# Patient Record
Sex: Male | Born: 1990 | Race: White | Hispanic: No | Smoking: Never smoker
Health system: Southern US, Community
[De-identification: ages and names within clinical notes are randomized; demographics above are authoritative.]

## PROBLEM LIST (undated history)

## (undated) DIAGNOSIS — F329 Major depressive disorder, single episode, unspecified: Secondary | ICD-10-CM

## (undated) DIAGNOSIS — F32A Depression, unspecified: Secondary | ICD-10-CM

## (undated) DIAGNOSIS — F419 Anxiety disorder, unspecified: Secondary | ICD-10-CM

## (undated) DIAGNOSIS — Z87820 Personal history of traumatic brain injury: Secondary | ICD-10-CM

## (undated) DIAGNOSIS — G43909 Migraine, unspecified, not intractable, without status migrainosus: Secondary | ICD-10-CM

## (undated) HISTORY — PX: FRACTURE SURGERY: SHX138

---
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
## 2013-07-30 ENCOUNTER — Emergency Department (HOSPITAL_BASED_OUTPATIENT_CLINIC_OR_DEPARTMENT_OTHER)
Admission: EM | Admit: 2013-07-30 | Discharge: 2013-07-30 | Disposition: A | Discharge status: Home / Self Care | Payer: Worker's Compensation | Attending: Emergency Medicine | Admitting: Emergency Medicine

## 2013-07-30 ENCOUNTER — Encounter (HOSPITAL_BASED_OUTPATIENT_CLINIC_OR_DEPARTMENT_OTHER): Payer: Self-pay | Admitting: Emergency Medicine

## 2013-07-30 DIAGNOSIS — Z880 Allergy status to penicillin: Secondary | ICD-10-CM | POA: Insufficient documentation

## 2013-07-30 DIAGNOSIS — Y9301 Activity, walking, marching and hiking: Secondary | ICD-10-CM | POA: Insufficient documentation

## 2013-07-30 DIAGNOSIS — S0083XA Contusion of other part of head, initial encounter: Secondary | ICD-10-CM

## 2013-07-30 DIAGNOSIS — Y9289 Other specified places as the place of occurrence of the external cause: Secondary | ICD-10-CM | POA: Insufficient documentation

## 2013-07-30 DIAGNOSIS — W2209XA Striking against other stationary object, initial encounter: Secondary | ICD-10-CM | POA: Insufficient documentation

## 2013-07-30 DIAGNOSIS — S0003XA Contusion of scalp, initial encounter: Secondary | ICD-10-CM | POA: Insufficient documentation

## 2013-07-30 NOTE — ED Provider Notes (Signed)
Medical screening examination/treatment/procedure(s) were performed by non-physician practitioner and as supervising physician I was immediately available for consultation/collaboration.  EKG Interpretation   None        Yarrow Linhart, MD 07/30/13 1956 

## 2013-07-30 NOTE — ED Notes (Signed)
Hit the top of his head on a wooden header when walking down steps. No loc. Small hematoma palpated.

## 2013-07-30 NOTE — ED Provider Notes (Signed)
CSN: [id number]     Arrival date & time 07/30/13  1835 History   First MD Initiated Contact with Patient 07/30/13 1902     Chief Complaint  Patient presents with  . Head Injury   (Consider location/radiation/quality/duration/timing/severity/associated sxs/prior Treatment) Patient is a 22 y.o. male presenting with head injury. The history is provided by the patient. No language interpreter was used.  Head Injury Location:  Generalized Time since incident:  1 day Mechanism of injury: direct blow   Pain details:    Quality:  Aching   Severity:  Mild   Timing:  Constant Chronicity:  New Relieved by:  Nothing Worsened by:  Nothing tried Ineffective treatments:  None tried Associated symptoms: headache   Associated symptoms: no blurred vision, no double vision, no loss of consciousness, no nausea and no vomiting     History reviewed. No pertinent past medical history. History reviewed. No pertinent past surgical history. No family history on file. History  Substance Use Topics  . Smoking status: Never Smoker   . Smokeless tobacco: Not on file  . Alcohol Use: Yes    Review of Systems  Eyes: Negative for blurred vision and double vision.  Gastrointestinal: Negative for nausea and vomiting.  Neurological: Positive for headaches. Negative for loss of consciousness.  All other systems reviewed and are negative.    Allergies  Penicillins  Home Medications  No current outpatient prescriptions on file. BP 134/75  Pulse 78  Temp(Src) 97.8 F (36.6 C) (Oral)  Resp 24  Ht 6' (1.829 m)  Wt 165 lb (74.844 kg)  BMI 22.37 kg/m2  SpO2 100% Physical Exam  Nursing note and vitals reviewed. Constitutional: He is oriented to person, place, and time. He appears well-developed and well-nourished.  HENT:  Head: Normocephalic.  Right Ear: External ear normal.  Left Ear: External ear normal.  Nose: Nose normal.  Mouth/Throat: Oropharynx is clear and moist.  Eyes: Conjunctivae and  EOM are normal. Pupils are equal, round, and reactive to light.  Neck: Normal range of motion. Neck supple.  Cardiovascular: Normal rate.   Pulmonary/Chest: Effort normal.  Abdominal: Soft.  Musculoskeletal: Normal range of motion.  Neurological: He is alert and oriented to person, place, and time. He has normal reflexes.  Skin: Skin is warm.  Psychiatric: He has a normal mood and affect.    ED Course  Procedures (including critical care time) Labs Review Labs Reviewed - No data to display Imaging Review No results found.  EKG Interpretation   None       MDM   1. Contusion of forehead, initial encounter     Tylenol  For headache    Elson Areas, PA-C 07/30/13 1934

## 2015-09-13 ENCOUNTER — Emergency Department (HOSPITAL_COMMUNITY): Payer: Managed Care, Other (non HMO)

## 2015-09-13 ENCOUNTER — Emergency Department (HOSPITAL_COMMUNITY)
Admission: EM | Admit: 2015-09-13 | Discharge: 2015-09-13 | Disposition: A | Discharge status: Home / Self Care | Payer: Managed Care, Other (non HMO) | Attending: Emergency Medicine | Admitting: Emergency Medicine

## 2015-09-13 ENCOUNTER — Encounter (HOSPITAL_COMMUNITY): Payer: Self-pay | Admitting: Emergency Medicine

## 2015-09-13 DIAGNOSIS — Z880 Allergy status to penicillin: Secondary | ICD-10-CM | POA: Diagnosis not present

## 2015-09-13 DIAGNOSIS — Y92009 Unspecified place in unspecified non-institutional (private) residence as the place of occurrence of the external cause: Secondary | ICD-10-CM | POA: Insufficient documentation

## 2015-09-13 DIAGNOSIS — S42491A Other displaced fracture of lower end of right humerus, initial encounter for closed fracture: Secondary | ICD-10-CM | POA: Insufficient documentation

## 2015-09-13 DIAGNOSIS — S4991XA Unspecified injury of right shoulder and upper arm, initial encounter: Secondary | ICD-10-CM | POA: Diagnosis present

## 2015-09-13 DIAGNOSIS — W109XXA Fall (on) (from) unspecified stairs and steps, initial encounter: Secondary | ICD-10-CM | POA: Diagnosis not present

## 2015-09-13 DIAGNOSIS — Y9389 Activity, other specified: Secondary | ICD-10-CM | POA: Diagnosis not present

## 2015-09-13 DIAGNOSIS — Y998 Other external cause status: Secondary | ICD-10-CM | POA: Insufficient documentation

## 2015-09-13 DIAGNOSIS — Z79899 Other long term (current) drug therapy: Secondary | ICD-10-CM | POA: Insufficient documentation

## 2015-09-13 DIAGNOSIS — S42301A Unspecified fracture of shaft of humerus, right arm, initial encounter for closed fracture: Secondary | ICD-10-CM

## 2015-09-13 LAB — CBC
HEMATOCRIT: 40.5 % (ref 39.0–52.0)
HEMOGLOBIN: 14.3 g/dL (ref 13.0–17.0)
MCH: 31.3 pg (ref 26.0–34.0)
MCHC: 35.3 g/dL (ref 30.0–36.0)
MCV: 88.6 fL (ref 78.0–100.0)
Platelets: 288 10*3/uL (ref 150–400)
RBC: 4.57 MIL/uL (ref 4.22–5.81)
RDW: 12.6 % (ref 11.5–15.5)
WBC: 15 10*3/uL — AB (ref 4.0–10.5)

## 2015-09-13 LAB — COMPREHENSIVE METABOLIC PANEL
ALBUMIN: 4 g/dL (ref 3.5–5.0)
ALT: 16 U/L — ABNORMAL LOW (ref 17–63)
ANION GAP: 14 (ref 5–15)
AST: 22 U/L (ref 15–41)
Alkaline Phosphatase: 41 U/L (ref 38–126)
BILIRUBIN TOTAL: 0.6 mg/dL (ref 0.3–1.2)
BUN: 18 mg/dL (ref 6–20)
CHLORIDE: 103 mmol/L (ref 101–111)
CO2: 21 mmol/L — ABNORMAL LOW (ref 22–32)
Calcium: 8.8 mg/dL — ABNORMAL LOW (ref 8.9–10.3)
Creatinine, Ser: 0.91 mg/dL (ref 0.61–1.24)
GFR calc Af Amer: >60 mL/min (ref >60)
Glucose, Bld: 113 mg/dL — ABNORMAL HIGH (ref 65–99)
POTASSIUM: 3.7 mmol/L (ref 3.5–5.1)
Sodium: 138 mmol/L (ref 135–145)
TOTAL PROTEIN: 6.6 g/dL (ref 6.5–8.1)

## 2015-09-13 LAB — PROTIME-INR
INR: 1.13 (ref 0.00–1.49)
PROTHROMBIN TIME: 14.7 s (ref 11.6–15.2)

## 2015-09-13 LAB — ETHANOL: Alcohol, Ethyl (B): 96 mg/dL — ABNORMAL HIGH (ref <5)

## 2015-09-13 MED ORDER — FENTANYL CITRATE (PF) 100 MCG/2ML IJ SOLN
50.0000 ug | Freq: Once | INTRAMUSCULAR | Status: AC
  Administered 2015-09-13: 50 ug via INTRAVENOUS
  Filled 2015-09-13: qty 2

## 2015-09-13 MED ORDER — HYDROMORPHONE HCL 1 MG/ML IJ SOLN
1.0000 mg | Freq: Once | INTRAMUSCULAR | Status: AC
  Administered 2015-09-13: 1 mg via INTRAVENOUS
  Filled 2015-09-13: qty 1

## 2015-09-13 MED ORDER — OXYCODONE-ACETAMINOPHEN 5-325 MG PO TABS
2.0000 | ORAL_TABLET | ORAL | Status: DC | PRN
Start: 2015-09-13 — End: 2015-10-05

## 2015-09-13 MED ORDER — ONDANSETRON HCL 4 MG/2ML IJ SOLN
4.0000 mg | Freq: Once | INTRAMUSCULAR | Status: AC
  Administered 2015-09-13: 4 mg via INTRAVENOUS
  Filled 2015-09-13: qty 2

## 2015-09-13 MED ORDER — OXYCODONE-ACETAMINOPHEN 5-325 MG PO TABS
1.0000 | ORAL_TABLET | Freq: Once | ORAL | Status: AC
  Administered 2015-09-13: 1 via ORAL
  Filled 2015-09-13: qty 1

## 2015-09-13 NOTE — ED Notes (Signed)
Pt given 150 fentanyl en route

## 2015-09-13 NOTE — ED Notes (Signed)
Pt mother at nurses station stating that this RN was refusing to give the pt pain medicine. Told pt mother that I had Fentanyl in my hand to admin to pt per triage pain protocols orders and I was not allowed to admin other drugs until the pt had been seen by a provider

## 2015-09-13 NOTE — ED Notes (Addendum)
Pt mother at nurses station again stating that it is ridiculous that we are not doing anything for her sons pain. Explained to mother that I had been in another room helping another patient and I would be getting her sons pain medicine shortly.

## 2015-09-13 NOTE — ED Notes (Signed)
PA Riley at bedside.  

## 2015-09-13 NOTE — ED Provider Notes (Signed)
MSE was initiated and I personally evaluated the patient and placed orders (if any) at  5:22 AM on September 13, 2015.  The patient appears stable so that the remainder of the MSE may be completed by another provider.  Patient presents following a fall. Right upper arm deformity. EtOH on board. Reports a mechanical fall. Denies loss of consciousness. Is otherwise healthy and not on any anticoagulants. Denies pain elsewhere. Denies any abdominal pain shortness of breath, chest pain. ABCs are intact. C-collar in place and no midline tenderness. Normocephalic, atraumatic, deformity noted of the right upper extremity, neurovascularly intact distally. No other obvious injuries. X-rays placed. Will obtain CT head and neck given the patient is intoxicated. Pain medication ordered and basic labwork ordered.  Shon Baton, MD 09/13/15 [phone]

## 2015-09-13 NOTE — ED Notes (Signed)
Patient ambulated with this RN and EDT Gavin Pound. Pt tolerated ambulation well and appeared steady on his feet. Pt provided graham crackers and PO fluids per provider's request.

## 2015-09-13 NOTE — Progress Notes (Signed)
Orthopedic Tech Progress Note Patient Details:  Samuel Herrera [birth year] [id number]  Ortho Devices Type of Ortho Device: Coapt Ortho Device/Splint Location: rue Ortho Device/Splint Interventions: Application   Kendy Haston 09/13/2015, 7:21 AM

## 2015-09-13 NOTE — Discharge Instructions (Signed)
Mr. Schneur Streater,  NicLewis Keats you! Please follow-up with [facility]. Return to the emergency department if you have increased pain, have color changes in your fingers, lose consciousness, have trouble breathing, or lose control of your bladder or bowel. Feel better soon!  S. Lane Hacker, PA-C

## 2015-09-13 NOTE — ED Notes (Signed)
Pt in EMS from home, pt tripped and fell down stairs. Obvious deformity to RUE. Abrasions noted to L chest. Pt denies LOC but unsure if he hit head. Pt in c-collar for precaution. Pt A/OX4. ETOH on board

## 2015-09-13 NOTE — ED Notes (Signed)
PA at bedside explain plan of care to pt and family, pt family became upset and stated that they would like to speak to the physician. Dr Wilkie Aye en route to talk to family

## 2015-09-13 NOTE — ED Notes (Signed)
Orthopedic tech at bedside to apply splint

## 2015-09-13 NOTE — ED Notes (Signed)
Ortho tech en route 

## 2015-09-13 NOTE — ED Notes (Signed)
Horton MD at bedside.  

## 2015-09-13 NOTE — ED Provider Notes (Signed)
CSN: [id number]     Arrival date & time 09/13/15  0458 History   First MD Initiated Contact with Patient 09/13/15 [phone]     Chief Complaint  Patient presents with  . Fall  . Arm Injury   HPI   Samuel Herrera is a 25 y.o. M with no significant PMH presenting s/p mechanical fall down a flight of steps at home. He endorses right upper arm pain and describes the pain as 7/10 pain scale, constant, non-radiating, worsened with movement and palpation. He has been drinking. He may have hit his head. He denies LOC, HA, CP, SOB, anticoagulant use, loss of bladder/bowel control.   History reviewed. No pertinent past medical history. History reviewed. No pertinent past surgical history. No family history on file. Social History  Substance Use Topics  . Smoking status: Never Smoker   . Smokeless tobacco: None  . Alcohol Use: Yes    Review of Systems  Ten systems are reviewed and are negative for acute change except as noted in the HPI  Allergies  Penicillins  Home Medications   Prior to Admission medications   Medication Sig Start Date End Date Taking? Authorizing Provider  amphetamine-dextroamphetamine (ADDERALL XR) 20 MG 24 hr capsule Take 20 mg by mouth daily. 08/24/15 09/22/15 Yes Historical Provider, MD   BP 125/89 mmHg  Pulse 78  Temp(Src) 98.1 F (36.7 C) (Oral)  Resp 18  SpO2 100% Physical Exam  Constitutional: He is oriented to person, place, and time. He appears well-developed and well-nourished. No distress.  HENT:  Head: Normocephalic and atraumatic.  Mouth/Throat: Oropharynx is clear and moist. No oropharyngeal exudate.  Eyes: Conjunctivae are normal. Right eye exhibits no discharge. Left eye exhibits no discharge. No scleral icterus.  Neck: No tracheal deviation present.  Cardiovascular: Normal rate, regular rhythm, normal heart sounds and intact distal pulses.  Exam reveals no gallop and no friction rub.   No murmur heard. Pulmonary/Chest: Effort normal and breath sounds  normal. No respiratory distress. He has no wheezes. He has no rales. He exhibits no tenderness.  Abdominal: Soft. Bowel sounds are normal. He exhibits no distension and no mass. There is no tenderness. There is no rebound and no guarding.  Musculoskeletal: He exhibits tenderness. He exhibits no edema.  Right upper arm deformity.  Cervical, thoracic, and lumbar spine without tenderness, stepoff or deformity.  Neurovascularly intact BL.   Lymphadenopathy:    He has no cervical adenopathy.  Neurological: He is alert and oriented to person, place, and time. Coordination normal.  Skin: Skin is warm and dry. No rash noted. He is not diaphoretic. No erythema.  Psychiatric: He has a normal mood and affect. His behavior is normal.  Nursing note and vitals reviewed.   ED Course  Procedures  Labs Review Labs Reviewed  COMPREHENSIVE METABOLIC PANEL - Abnormal; Notable for the following:    CO2 21 (*)    Glucose, Bld 113 (*)    Calcium 8.8 (*)    ALT 16 (*)    All other components within normal limits  CBC - Abnormal; Notable for the following:    WBC 15.0 (*)    All other components within normal limits  ETHANOL - Abnormal; Notable for the following:    Alcohol, Ethyl (B) 96 (*)    All other components within normal limits  PROTIME-INR  SAMPLE TO BLOOD BANK   Imaging Review Dg Pelvis Portable  09/13/2015  CLINICAL DATA:  Fall down stairs with pain in the right humerus.  Initial encounter. EXAM: PORTABLE PELVIS 1-2 VIEWS COMPARISON:  None. FINDINGS: Artifact from the backboard. The iliac crests are partially excluded from view. Negative for pelvic ring fracture or diastasis. Polygonal structure overlapping the proximal left femur is likely external to the patient as there is no donor fracture site. IMPRESSION: Negative visualized pelvis. Electronically Signed   By: Marnee Spring M.D.   On: 09/13/2015 05:59   Dg Chest Portable 1 View  09/13/2015  CLINICAL DATA:  Fall downstairs with pain in  the right humerus. Initial encounter. EXAM: PORTABLE CHEST 1 VIEW COMPARISON:  None. FINDINGS: Artifact over the chest from back board and EKG leads. Normal heart size and mediastinal contours. No acute infiltrate or edema. No effusion or pneumothorax. No acute osseous findings. IMPRESSION: Negative portable chest. Electronically Signed   By: Marnee Spring M.D.   On: 09/13/2015 05:50   Dg Humerus Right  09/13/2015  CLINICAL DATA:  Fall down stairs with pain in the humerus. Initial encounter. EXAM: RIGHT HUMERUS - 2+ VIEW COMPARISON:  None. FINDINGS: Predominately transverse fracture through the humeral diaphysis with 100% lateral displacement and 16 mm of fracture over riding. Comminution present around the fracture margin. No evidence of dislocation. IMPRESSION: Displaced and over riding humeral diaphysis fracture. Electronically Signed   By: Marnee Spring M.D.   On: 09/13/2015 06:00   I have personally reviewed and evaluated these images and lab results as part of my medical decision-making.  MDM   Final diagnoses:  Humerus fracture, right, closed, initial encounter   Patient non-toxic appearing and VSS. S/p mechanical fall.  Right humerus fracture. Per Alphonsa Overall, PA-C, Coaptation splint, pain meds, and ortho follow-up.  Pelvis xrays, CXR, CMP, INR, CT head and c-spine negative.  CBC with leukocytosis if 15.0, likely due to pain.   Medications  fentaNYL (SUBLIMAZE) injection 50 mcg (50 mcg Intravenous Given 09/13/15 0513)  HYDROmorphone (DILAUDID) injection 1 mg (1 mg Intravenous Given 09/13/15 0537)  ondansetron (ZOFRAN) injection 4 mg (4 mg Intravenous Given 09/13/15 0537)  HYDROmorphone (DILAUDID) injection 1 mg (1 mg Intravenous Given 09/13/15 0701)  oxyCODONE-acetaminophen (PERCOCET/ROXICET) 5-325 MG per tablet 1 tablet (1 tablet Oral Given 09/13/15 0725)   Patient feels improved after observation and/or treatment in ED. Patient ambulated and tolerated PO fluids.   Patient may be safely  discharged home with  New Prescriptions   OXYCODONE-ACETAMINOPHEN (PERCOCET/ROXICET) 5-325 MG TABLET    Take 2 tablets by mouth every 4 (four) hours as needed for severe pain.   Discussed reasons for return. Patient to follow-up with orthopedics. Patient's family in understanding and agreement with the plan.   Melton Krebs, PA-C 09/13/15 1610  Shon Baton, MD 09/13/15 [phone]

## 2015-09-14 LAB — SAMPLE TO BLOOD BANK

## 2015-09-27 ENCOUNTER — Other Ambulatory Visit: Payer: Self-pay | Admitting: Orthopedic Surgery

## 2015-10-02 ENCOUNTER — Encounter (HOSPITAL_COMMUNITY)
Admission: RE | Admit: 2015-10-02 | Discharge: 2015-10-02 | Disposition: A | Discharge status: Home / Self Care | Payer: Managed Care, Other (non HMO) | Source: Ambulatory Visit | Attending: Orthopedic Surgery | Admitting: Orthopedic Surgery

## 2015-10-02 ENCOUNTER — Encounter (HOSPITAL_COMMUNITY): Payer: Self-pay

## 2015-10-02 DIAGNOSIS — W109XXA Fall (on) (from) unspecified stairs and steps, initial encounter: Secondary | ICD-10-CM | POA: Diagnosis not present

## 2015-10-02 DIAGNOSIS — S42301A Unspecified fracture of shaft of humerus, right arm, initial encounter for closed fracture: Secondary | ICD-10-CM | POA: Diagnosis not present

## 2015-10-02 DIAGNOSIS — S6421XA Injury of radial nerve at wrist and hand level of right arm, initial encounter: Secondary | ICD-10-CM | POA: Diagnosis not present

## 2015-10-02 DIAGNOSIS — Z79899 Other long term (current) drug therapy: Secondary | ICD-10-CM | POA: Diagnosis not present

## 2015-10-02 DIAGNOSIS — F988 Other specified behavioral and emotional disorders with onset usually occurring in childhood and adolescence: Secondary | ICD-10-CM | POA: Diagnosis not present

## 2015-10-02 HISTORY — DX: Anxiety disorder, unspecified: F41.9

## 2015-10-02 LAB — CBC
HCT: 41.9 % (ref 39.0–52.0)
HEMOGLOBIN: 14.8 g/dL (ref 13.0–17.0)
MCH: 30.9 pg (ref 26.0–34.0)
MCHC: 35.3 g/dL (ref 30.0–36.0)
MCV: 87.5 fL (ref 78.0–100.0)
Platelets: 368 10*3/uL (ref 150–400)
RBC: 4.79 MIL/uL (ref 4.22–5.81)
RDW: 12.1 % (ref 11.5–15.5)
WBC: 9.8 10*3/uL (ref 4.0–10.5)

## 2015-10-02 LAB — BASIC METABOLIC PANEL
ANION GAP: 13 (ref 5–15)
BUN: 14 mg/dL (ref 6–20)
CALCIUM: 10 mg/dL (ref 8.9–10.3)
CO2: 24 mmol/L (ref 22–32)
CREATININE: 0.94 mg/dL (ref 0.61–1.24)
Chloride: 104 mmol/L (ref 101–111)
GFR calc Af Amer: >60 mL/min (ref >60)
GFR calc non Af Amer: >60 mL/min (ref >60)
GLUCOSE: 98 mg/dL (ref 65–99)
POTASSIUM: 4.1 mmol/L (ref 3.5–5.1)
SODIUM: 141 mmol/L (ref 135–145)

## 2015-10-02 MED ORDER — CHLORHEXIDINE GLUCONATE 4 % EX LIQD: 60.0000 mL | Freq: Once | CUTANEOUS | Status: DC

## 2015-10-02 MED ORDER — VANCOMYCIN HCL IN DEXTROSE 1-5 GM/200ML-% IV SOLN
1000.0000 mg | INTRAVENOUS | Status: AC
  Administered 2015-10-03: 1000 mg via INTRAVENOUS
  Filled 2015-10-02: qty 200

## 2015-10-02 NOTE — Pre-Procedure Instructions (Signed)
Samuel Herrera  10/02/2015      [facility] [address] [address], [place] - [address] AT [facility] OF [facility] & [address] [place] [address] Phone: [phone] Fax: [phone]    Your procedure is scheduled on 10/03/2015.  Report to [facility] Admitting at1P.M.  Call this number if you have problems the morning of surgery:  [phone]   Remember:  Do not eat food or drink liquids after midnight.  On Monday Night    Take these medicines the morning of surgery with A SIP OF WATER : Pain medicine if needed the morning of surgery - is OK   Do not wear jewelry   Do not wear lotions, powders, or perfumes.  You may NOT  wear deodorant.     Men may shave face and neck.   Do not bring valuables to the hospital.   [facility] is not responsible for any belongings or valuables.  Contacts, dentures or bridgework may not be worn into surgery.  Leave your suitcase in the car.  After surgery it may be brought to your room.  For patients admitted to the hospital, discharge time will be determined by your treatment team.  Patients discharged the day of surgery will not be allowed to drive home.   Name and phone number of your driver:   With parents    Special instructions:  Special Instructions: [facility] - Preparing for Surgery  Before surgery, you can play an important role.  Because skin is not sterile, your skin needs to be as free of germs as possible.  You can reduce the number of germs on you skin by washing with CHG (chlorahexidine gluconate) soap before surgery.  CHG is an antiseptic cleaner which kills germs and bonds with the skin to continue killing germs even after washing.  Please DO NOT use if you have an allergy to CHG or antibacterial soaps.  If your skin becomes reddened/irritated stop using the CHG and inform your nurse when you arrive at Short Stay.  Do not shave (including legs and underarms) for at least 48  hours prior to the first CHG shower.  You may shave your face.  Please follow these instructions carefully:   1.  Shower with CHG Soap the night before surgery and the  morning of Surgery.  2.  If you choose to wash your hair, wash your hair first as usual with your  normal shampoo.  3.  After you shampoo, rinse your hair and body thoroughly to remove the  Shampoo.  4.  Use CHG as you would any other liquid soap.  You can apply chg directly to the skin and wash gently with scrungie or a clean washcloth.  5.  Apply the CHG Soap to your body ONLY FROM THE NECK DOWN.    Do not use on open wounds or open sores.  Avoid contact with your eyes, ears, mouth and genitals (private parts).  Wash genitals (private parts)   with your normal soap.  6.  Wash thoroughly, paying special attention to the area where your surgery will be performed.  7.  Thoroughly rinse your body with warm water from the neck down.  8.  DO NOT shower/wash with your normal soap after using and rinsing off   the CHG Soap.  9.  Pat yourself dry with a clean towel.            10.  Wear clean pajamas.            11.  Place clean sheets on your bed the night of your first shower and do not sleep with pets.  Day of Surgery  Do not apply any lotions/deodorants the morning of surgery.  Please wear clean clothes to the hospital/surgery center.  Please read over the following fact sheets that you were given. Pain Booklet, Coughing and Deep Breathing and Surgical Site Infection Prevention

## 2015-10-02 NOTE — Pre-Procedure Instructions (Signed)
Samuel Herrera  10/02/2015      [facility]ORE [address] [address], [place] - [address] AT [facility] OF [facility] & [address] [place] [address] Phone: [phone] Fax: [phone]    Your procedure is scheduled on Tuesday February 21st.  Report to [facility] Admitting at 130 P.M.  Call this number if you have problems the morning of surgery:  [phone]   Remember:  Do not eat food or drink liquids after midnight.  Take these medicines the morning of surgery with A SIP OF WATER Oxycodone-acetaminophen (percocet/roxicet) if needed, Adderall XR if needed  STOP: ALL Vitamins, Supplements, Effient and Herbal Medications, Fish Oils, Aspirins, NSAIDs (Nonsteroidal Anti-inflammatories such as Ibuprofen, Aleve, or Advil), and Goody's/BC Powders tonight.   Do not wear jewelry.  Do not wear lotions, powders, or perfumes.  You may wear deodorant.  Men may shave face and neck.  Do not bring valuables to the hospital.  [facility] is not responsible for any belongings or valuables.  Contacts, dentures or bridgework may not be worn into surgery.  Leave your suitcase in the car.  After surgery it may be brought to your room.  For patients admitted to the hospital, discharge time will be determined by your treatment team.  Patients discharged the day of surgery will not be allowed to drive home.        Preparing for Surgery at [facility]  Before surgery, you can play an important role.  Because skin is not sterile, your skin needs to be as free of germs as possible.  You can reduce the number of germs on your skin by washing with CHG (chlorahexidine gluconate) Soap before surgery.  CHG is an antiseptic cleaner with kills germs and bonds with the skin to continue killing germs even after washing.   Please do not use if you have an allergy to CHG or antibacterial soaps.  If your skin becomes reddened/irritated stop using the CHG.  Do not  shave (including legs and underarms) for at least 48 hours prior to first CHG shower.  It is okay to shave your face.  Please follow these instructions carefully:  1. Shower with CHG Soap the night before surgery and the morning of Surgery. 2. If you choose to wash your hair, wash your hair first as usual with your normal shampoo. 3. After you shampoo, rinse your hair and body thoroughly to remove the Shampoo. 4. Use CHG as you would any other liquid soap. You can apply chg directly to the skin and wash gently with scrungie or a clean washcloth. 5. Apply the CHG Soap to your body ONLY FROM THE NECK DOWN. Do not use on open wounds or open sores. Avoid contact with your eyes, ears, mouth and genitals (private parts). Wash genitals (private parts) with your normal soap. 6. Wash thoroughly, paying special attention to the area where your surgery will be performed. 7. Thoroughly rinse your body with warm water from the neck down. 8. DO NOT shower/wash with your normal soap after using and rinsing off the CHG Soap. 9. Pat yourself dry with a clean towel.  10. Wear clean pajamas.  11. Place clean sheets on your bed the night of your first shower and do not sleep with pets.  Day of Surgery  Do not apply any lotions/deodorants the morning of surgery. Please wear clean clothes to the hospital/surgery center.   Please read over the following fact  sheets that you were given. Pain Booklet, Coughing and Deep Breathing, Incentive Spirometry, and Surgical Site Infection

## 2015-10-02 NOTE — Progress Notes (Signed)
Pt. Denies all cardiac/ pulmonary complaints. 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

## 2015-10-03 ENCOUNTER — Observation Stay (HOSPITAL_COMMUNITY)
Admission: RE | Admit: 2015-10-03 | Discharge: 2015-10-05 | Disposition: A | Discharge status: Home / Self Care | Payer: Managed Care, Other (non HMO) | Source: Ambulatory Visit | Attending: Orthopedic Surgery | Admitting: Orthopedic Surgery

## 2015-10-03 ENCOUNTER — Ambulatory Visit (HOSPITAL_COMMUNITY): Payer: Managed Care, Other (non HMO) | Admitting: Anesthesiology

## 2015-10-03 ENCOUNTER — Encounter (HOSPITAL_COMMUNITY)
Admission: RE | Disposition: A | Discharge status: Home / Self Care | Payer: Self-pay | Source: Ambulatory Visit | Attending: Orthopedic Surgery

## 2015-10-03 DIAGNOSIS — S6421XA Injury of radial nerve at wrist and hand level of right arm, initial encounter: Secondary | ICD-10-CM | POA: Insufficient documentation

## 2015-10-03 DIAGNOSIS — F988 Other specified behavioral and emotional disorders with onset usually occurring in childhood and adolescence: Secondary | ICD-10-CM | POA: Insufficient documentation

## 2015-10-03 DIAGNOSIS — S42309A Unspecified fracture of shaft of humerus, unspecified arm, initial encounter for closed fracture: Secondary | ICD-10-CM | POA: Diagnosis present

## 2015-10-03 DIAGNOSIS — W109XXA Fall (on) (from) unspecified stairs and steps, initial encounter: Secondary | ICD-10-CM | POA: Insufficient documentation

## 2015-10-03 DIAGNOSIS — Z79899 Other long term (current) drug therapy: Secondary | ICD-10-CM | POA: Insufficient documentation

## 2015-10-03 DIAGNOSIS — S42301A Unspecified fracture of shaft of humerus, right arm, initial encounter for closed fracture: Principal | ICD-10-CM | POA: Insufficient documentation

## 2015-10-03 HISTORY — DX: Unspecified fracture of shaft of humerus, unspecified arm, initial encounter for closed fracture: S42.309A

## 2015-10-03 HISTORY — PX: ORIF HUMERUS FRACTURE: SHX2126

## 2015-10-03 SURGERY — OPEN REDUCTION INTERNAL FIXATION (ORIF) DISTAL HUMERUS FRACTURE
Anesthesia: General | Laterality: Right

## 2015-10-03 MED ORDER — SUGAMMADEX SODIUM 200 MG/2ML IV SOLN
INTRAVENOUS | Status: AC
  Filled 2015-10-03: qty 2

## 2015-10-03 MED ORDER — SUGAMMADEX SODIUM 200 MG/2ML IV SOLN
INTRAVENOUS | Status: DC | PRN
  Administered 2015-10-03: 200 mg via INTRAVENOUS

## 2015-10-03 MED ORDER — ONDANSETRON HCL 4 MG/2ML IJ SOLN
INTRAMUSCULAR | Status: AC
  Filled 2015-10-03: qty 2

## 2015-10-03 MED ORDER — PROPOFOL 10 MG/ML IV BOLUS
INTRAVENOUS | Status: DC | PRN
  Administered 2015-10-03: 20 mg via INTRAVENOUS
  Administered 2015-10-03: 200 mg via INTRAVENOUS

## 2015-10-03 MED ORDER — AMPHETAMINE-DEXTROAMPHET ER 10 MG PO CP24
20.0000 mg | ORAL_CAPSULE | Freq: Every day | ORAL | Status: DC
  Filled 2015-10-03 (×2): qty 2

## 2015-10-03 MED ORDER — PHENYLEPHRINE HCL 10 MG/ML IJ SOLN
INTRAMUSCULAR | Status: DC | PRN
  Administered 2015-10-03: 80 ug via INTRAVENOUS

## 2015-10-03 MED ORDER — FENTANYL CITRATE (PF) 250 MCG/5ML IJ SOLN
INTRAMUSCULAR | Status: AC
Start: 2015-10-03 — End: 2015-10-03
  Filled 2015-10-03: qty 5

## 2015-10-03 MED ORDER — MIDAZOLAM HCL 2 MG/2ML IJ SOLN
INTRAMUSCULAR | Status: AC
  Filled 2015-10-03: qty 2

## 2015-10-03 MED ORDER — PROMETHAZINE HCL 25 MG/ML IJ SOLN
INTRAMUSCULAR | Status: AC
  Filled 2015-10-03: qty 1

## 2015-10-03 MED ORDER — ROCURONIUM BROMIDE 100 MG/10ML IV SOLN
INTRAVENOUS | Status: DC | PRN
Start: 2015-10-03 — End: 2015-10-03
  Administered 2015-10-03: 50 mg via INTRAVENOUS

## 2015-10-03 MED ORDER — EPHEDRINE SULFATE 50 MG/ML IJ SOLN
INTRAMUSCULAR | Status: AC
  Filled 2015-10-03: qty 1

## 2015-10-03 MED ORDER — MEPERIDINE HCL 25 MG/ML IJ SOLN: 6.2500 mg | INTRAMUSCULAR | Status: DC | PRN

## 2015-10-03 MED ORDER — ROCURONIUM BROMIDE 50 MG/5ML IV SOLN
INTRAVENOUS | Status: AC
  Filled 2015-10-03: qty 1

## 2015-10-03 MED ORDER — OXYCODONE HCL 5 MG PO TABS
ORAL_TABLET | ORAL | Status: AC
  Filled 2015-10-03: qty 2

## 2015-10-03 MED ORDER — PROMETHAZINE HCL 25 MG RE SUPP: 12.5000 mg | Freq: Four times a day (QID) | RECTAL | Status: DC | PRN

## 2015-10-03 MED ORDER — METHOCARBAMOL 1000 MG/10ML IJ SOLN
500.0000 mg | Freq: Four times a day (QID) | INTRAVENOUS | Status: DC | PRN
  Filled 2015-10-03: qty 5

## 2015-10-03 MED ORDER — MIDAZOLAM HCL 5 MG/5ML IJ SOLN
INTRAMUSCULAR | Status: DC | PRN
  Administered 2015-10-03: 2 mg via INTRAVENOUS

## 2015-10-03 MED ORDER — LIDOCAINE HCL (CARDIAC) 20 MG/ML IV SOLN
INTRAVENOUS | Status: AC
  Filled 2015-10-03: qty 5

## 2015-10-03 MED ORDER — FENTANYL CITRATE (PF) 100 MCG/2ML IJ SOLN
INTRAMUSCULAR | Status: DC | PRN
  Administered 2015-10-03 (×4): 50 ug via INTRAVENOUS
  Administered 2015-10-03: 200 ug via INTRAVENOUS
  Administered 2015-10-03 (×2): 50 ug via INTRAVENOUS

## 2015-10-03 MED ORDER — LACTATED RINGERS IV SOLN
INTRAVENOUS | Status: DC
  Administered 2015-10-03 (×3): via INTRAVENOUS

## 2015-10-03 MED ORDER — HYDROMORPHONE HCL 1 MG/ML IJ SOLN
0.2500 mg | INTRAMUSCULAR | Status: DC | PRN
  Administered 2015-10-03 (×4): 0.5 mg via INTRAVENOUS

## 2015-10-03 MED ORDER — HYDROMORPHONE HCL 1 MG/ML IJ SOLN
INTRAMUSCULAR | Status: AC
  Filled 2015-10-03: qty 2

## 2015-10-03 MED ORDER — HYDROMORPHONE HCL 1 MG/ML IJ SOLN
0.5000 mg | INTRAMUSCULAR | Status: DC | PRN
  Administered 2015-10-03 – 2015-10-04 (×2): 1 mg via INTRAVENOUS
  Administered 2015-10-04: 0.5 mg via INTRAVENOUS
  Administered 2015-10-04 (×2): 1 mg via INTRAVENOUS
  Filled 2015-10-03 (×6): qty 1

## 2015-10-03 MED ORDER — BUPIVACAINE-EPINEPHRINE (PF) 0.5% -1:200000 IJ SOLN
INTRAMUSCULAR | Status: AC
  Filled 2015-10-03: qty 30

## 2015-10-03 MED ORDER — LIDOCAINE HCL (CARDIAC) 20 MG/ML IV SOLN
INTRAVENOUS | Status: DC | PRN
  Administered 2015-10-03: 30 mg via INTRAVENOUS

## 2015-10-03 MED ORDER — SODIUM CHLORIDE 0.9 % IJ SOLN
INTRAMUSCULAR | Status: AC
  Filled 2015-10-03: qty 10

## 2015-10-03 MED ORDER — LACTATED RINGERS IV SOLN
INTRAVENOUS | Status: DC
  Administered 2015-10-03: via INTRAVENOUS

## 2015-10-03 MED ORDER — SUCCINYLCHOLINE CHLORIDE 20 MG/ML IJ SOLN
INTRAMUSCULAR | Status: AC
  Filled 2015-10-03: qty 1

## 2015-10-03 MED ORDER — METHOCARBAMOL 500 MG PO TABS
500.0000 mg | ORAL_TABLET | Freq: Four times a day (QID) | ORAL | Status: DC | PRN
  Administered 2015-10-03 – 2015-10-05 (×7): 500 mg via ORAL
  Filled 2015-10-03 (×6): qty 1

## 2015-10-03 MED ORDER — 0.9 % SODIUM CHLORIDE (POUR BTL) OPTIME
TOPICAL | Status: DC | PRN
  Administered 2015-10-03: 1000 mL

## 2015-10-03 MED ORDER — ONDANSETRON HCL 4 MG/2ML IJ SOLN
INTRAMUSCULAR | Status: DC | PRN
  Administered 2015-10-03: 4 mg via INTRAVENOUS

## 2015-10-03 MED ORDER — FENTANYL CITRATE (PF) 250 MCG/5ML IJ SOLN
INTRAMUSCULAR | Status: AC
  Filled 2015-10-03: qty 5

## 2015-10-03 MED ORDER — SENNA 8.6 MG PO TABS
1.0000 | ORAL_TABLET | Freq: Two times a day (BID) | ORAL | Status: DC
  Administered 2015-10-03 – 2015-10-05 (×4): 8.6 mg via ORAL
  Filled 2015-10-03 (×5): qty 1

## 2015-10-03 MED ORDER — FAMOTIDINE 20 MG PO TABS: 20.0000 mg | ORAL_TABLET | Freq: Two times a day (BID) | ORAL | Status: DC | PRN

## 2015-10-03 MED ORDER — PHENYLEPHRINE 40 MCG/ML (10ML) SYRINGE FOR IV PUSH (FOR BLOOD PRESSURE SUPPORT)
PREFILLED_SYRINGE | INTRAVENOUS | Status: AC
  Filled 2015-10-03: qty 10

## 2015-10-03 MED ORDER — VECURONIUM BROMIDE 10 MG IV SOLR
INTRAVENOUS | Status: DC | PRN
  Administered 2015-10-03 (×2): 2 mg via INTRAVENOUS

## 2015-10-03 MED ORDER — METHOCARBAMOL 500 MG PO TABS
ORAL_TABLET | ORAL | Status: AC
  Filled 2015-10-03: qty 1

## 2015-10-03 MED ORDER — ONDANSETRON HCL 4 MG PO TABS: 4.0000 mg | ORAL_TABLET | Freq: Four times a day (QID) | ORAL | Status: DC | PRN

## 2015-10-03 MED ORDER — MIDAZOLAM HCL 2 MG/2ML IJ SOLN: 0.5000 mg | Freq: Once | INTRAMUSCULAR | Status: DC | PRN

## 2015-10-03 MED ORDER — VANCOMYCIN HCL IN DEXTROSE 1-5 GM/200ML-% IV SOLN
1000.0000 mg | Freq: Two times a day (BID) | INTRAVENOUS | Status: DC
Start: 2015-10-04 — End: 2015-10-05
  Administered 2015-10-04 – 2015-10-05 (×3): 1000 mg via INTRAVENOUS
  Filled 2015-10-03 (×4): qty 200

## 2015-10-03 MED ORDER — PROPOFOL 10 MG/ML IV BOLUS
INTRAVENOUS | Status: AC
  Filled 2015-10-03: qty 20

## 2015-10-03 MED ORDER — BUPIVACAINE-EPINEPHRINE (PF) 0.5% -1:200000 IJ SOLN
INTRAMUSCULAR | Status: DC | PRN
  Administered 2015-10-03: 30 mL via PERINEURAL

## 2015-10-03 MED ORDER — ONDANSETRON HCL 4 MG/2ML IJ SOLN
4.0000 mg | Freq: Four times a day (QID) | INTRAMUSCULAR | Status: DC | PRN
  Administered 2015-10-03 – 2015-10-05 (×2): 4 mg via INTRAVENOUS
  Filled 2015-10-03 (×2): qty 2

## 2015-10-03 MED ORDER — ALPRAZOLAM 0.5 MG PO TABS: 0.5000 mg | ORAL_TABLET | Freq: Four times a day (QID) | ORAL | Status: DC | PRN

## 2015-10-03 MED ORDER — OXYCODONE HCL 5 MG PO TABS
5.0000 mg | ORAL_TABLET | ORAL | Status: DC | PRN
  Administered 2015-10-03 – 2015-10-04 (×3): 10 mg via ORAL
  Administered 2015-10-04: 5 mg via ORAL
  Administered 2015-10-04 – 2015-10-05 (×8): 10 mg via ORAL
  Filled 2015-10-03 (×10): qty 2
  Filled 2015-10-03: qty 1
  Filled 2015-10-03: qty 2

## 2015-10-03 MED ORDER — PROMETHAZINE HCL 25 MG/ML IJ SOLN
6.2500 mg | INTRAMUSCULAR | Status: DC | PRN
  Administered 2015-10-03: 6.25 mg via INTRAVENOUS

## 2015-10-03 SURGICAL SUPPLY — 64 items
BANDAGE ELASTIC 3 VELCRO ST LF (GAUZE/BANDAGES/DRESSINGS) IMPLANT
BANDAGE ELASTIC 4 VELCRO ST LF (GAUZE/BANDAGES/DRESSINGS) ×3 IMPLANT
BIT DRILL QC 3.3X195 (BIT) ×3 IMPLANT
BNDG ESMARK 4X9 LF (GAUZE/BANDAGES/DRESSINGS) ×3 IMPLANT
BNDG GAUZE ELAST 4 BULKY (GAUZE/BANDAGES/DRESSINGS) ×6 IMPLANT
CAP LOCK NCB (Cap) ×12 IMPLANT
CONT SPEC 4OZ CLIKSEAL STRL BL (MISCELLANEOUS) ×3 IMPLANT
CORDS BIPOLAR (ELECTRODE) ×3 IMPLANT
COVER MAYO STAND STRL (DRAPES) ×3 IMPLANT
COVER SURGICAL LIGHT HANDLE (MISCELLANEOUS) ×3 IMPLANT
CUFF TOURNIQUET SINGLE 18IN (TOURNIQUET CUFF) ×3 IMPLANT
CUFF TOURNIQUET SINGLE 24IN (TOURNIQUET CUFF) IMPLANT
DRAIN HEMOVAC 7FR (DRAIN) ×3 IMPLANT
DRAPE IMP U-DRAPE 54X76 (DRAPES) ×3 IMPLANT
DRAPE INCISE IOBAN 66X45 STRL (DRAPES) ×3 IMPLANT
DRAPE OEC MINIVIEW 54X84 (DRAPES) IMPLANT
DRSG MEPILEX BORDER 4X12 (GAUZE/BANDAGES/DRESSINGS) ×3 IMPLANT
GAUZE SPONGE 4X4 12PLY STRL (GAUZE/BANDAGES/DRESSINGS) IMPLANT
GAUZE XEROFORM 1X8 LF (GAUZE/BANDAGES/DRESSINGS) IMPLANT
GLOVE BIOGEL M STRL SZ7.5 (GLOVE) ×3 IMPLANT
GLOVE SS BIOGEL STRL SZ 8 (GLOVE) ×2 IMPLANT
GLOVE SS N UNI LF 7.0 STRL (GLOVE) ×3 IMPLANT
GLOVE SUPERSENSE BIOGEL SZ 8 (GLOVE) ×4
GOWN STRL REUS W/ TWL LRG LVL3 (GOWN DISPOSABLE) ×2 IMPLANT
GOWN STRL REUS W/ TWL XL LVL3 (GOWN DISPOSABLE) ×3 IMPLANT
GOWN STRL REUS W/TWL LRG LVL3 (GOWN DISPOSABLE) ×4
GOWN STRL REUS W/TWL XL LVL3 (GOWN DISPOSABLE) ×6
KIT BASIN OR (CUSTOM PROCEDURE TRAY) ×3 IMPLANT
KIT ROOM TURNOVER OR (KITS) ×3 IMPLANT
MANIFOLD NEPTUNE II (INSTRUMENTS) ×3 IMPLANT
NEEDLE HYPO 25GX1X1/2 BEV (NEEDLE) IMPLANT
NS IRRIG 1000ML POUR BTL (IV SOLUTION) ×3 IMPLANT
PACK ORTHO EXTREMITY (CUSTOM PROCEDURE TRAY) ×3 IMPLANT
PACK UNIVERSAL I (CUSTOM PROCEDURE TRAY) ×3 IMPLANT
PAD ARMBOARD 7.5X6 YLW CONV (MISCELLANEOUS) ×6 IMPLANT
PAD CAST 4YDX4 CTTN HI CHSV (CAST SUPPLIES) IMPLANT
PADDING CAST COTTON 4X4 STRL (CAST SUPPLIES)
PLATE NCB STRT PA 146 10H (Plate) ×3 IMPLANT
PROTECTOR NERVE AXOGUARD 7X20 (Nerve Graft) ×3 IMPLANT
SCREW NCB 4.0 22MM (Screw) ×3 IMPLANT
SCREW NCB 4.0 28MM (Screw) ×3 IMPLANT
SCREW NCB 4.0X24MM (Screw) ×9 IMPLANT
SCREW NCB 4.0X26MM (Screw) ×12 IMPLANT
SOLUTION BETADINE 4OZ (MISCELLANEOUS) ×3 IMPLANT
SPONGE SCRUB IODOPHOR (GAUZE/BANDAGES/DRESSINGS) ×3 IMPLANT
STAPLE (STAPLE) ×3 IMPLANT
SUCTION FRAZIER HANDLE 10FR (MISCELLANEOUS)
SUCTION TUBE FRAZIER 10FR DISP (MISCELLANEOUS) IMPLANT
SUT ETHILON 8 0 BV130 4 (SUTURE) ×3 IMPLANT
SUT MERSILENE 4 0 P 3 (SUTURE) IMPLANT
SUT PROLENE 4 0 P 3 18 (SUTURE) ×12 IMPLANT
SUT PROLENE 4 0 PS 2 18 (SUTURE) ×3 IMPLANT
SUT PROLENE 4 0 SH DA (SUTURE) ×3 IMPLANT
SUT VIC AB 2-0 CT1 27 (SUTURE) ×6
SUT VIC AB 2-0 CT1 TAPERPNT 27 (SUTURE) ×3 IMPLANT
SUT VIC AB 3-0 X1 27 (SUTURE) ×6 IMPLANT
SYR CONTROL 10ML LL (SYRINGE) IMPLANT
TOWEL OR 17X24 6PK STRL BLUE (TOWEL DISPOSABLE) ×3 IMPLANT
TOWEL OR 17X26 10 PK STRL BLUE (TOWEL DISPOSABLE) ×6 IMPLANT
TUBE CONNECTING 12'X1/4 (SUCTIONS)
TUBE CONNECTING 12X1/4 (SUCTIONS) IMPLANT
UNDERPAD 30X30 INCONTINENT (UNDERPADS AND DIAPERS) ×3 IMPLANT
WATER STERILE IRR 1000ML POUR (IV SOLUTION) ×3 IMPLANT
YANKAUER SUCT BULB TIP NO VENT (SUCTIONS) ×3 IMPLANT

## 2015-10-03 NOTE — Anesthesia Procedure Notes (Signed)
Procedure Name: Intubation Date/Time: 10/03/2015 3:24 PM Performed by: Dairl Ponder Pre-anesthesia Checklist: Patient identified, Timeout performed, Emergency Drugs available, Suction available and Patient being monitored Patient Re-evaluated:Patient Re-evaluated prior to inductionOxygen Delivery Method: Circle system utilized Preoxygenation: Pre-oxygenation with 100% oxygen Intubation Type: IV induction Ventilation: Mask ventilation without difficulty Laryngoscope Size: Mac Grade View: Grade I Tube type: Oral Tube size: 7.5 mm Number of attempts: 1 Airway Equipment and Method: Stylet Placement Confirmation: ETT inserted through vocal cords under direct vision,  breath sounds checked- equal and bilateral and positive ETCO2 Secured at: 24 cm Tube secured with: Tape Dental Injury: Teeth and Oropharynx as per pre-operative assessment

## 2015-10-03 NOTE — Anesthesia Preprocedure Evaluation (Addendum)
Anesthesia Evaluation  Patient identified by MRN, date of birth, ID band Patient awake    Reviewed: Allergy & Precautions, H&P , Patient's Chart, lab work & pertinent test results, reviewed documented beta blocker date and time   Airway Mallampati: II  TM Distance: >3 FB Neck ROM: full    Dental no notable dental hx.    Pulmonary    Pulmonary exam normal breath sounds clear to auscultation       Cardiovascular  Rhythm:regular Rate:Normal     Neuro/Psych PSYCHIATRIC DISORDERS (ADD) Anxiety    GI/Hepatic (+)     substance abuse  marijuana use,   Endo/Other    Renal/GU      Musculoskeletal   Abdominal   Peds  Hematology   Anesthesia Other Findings   Reproductive/Obstetrics                            Anesthesia Physical Anesthesia Plan  ASA: II  Anesthesia Plan: General and Regional   Post-op Pain Management:    Induction: Intravenous  Airway Management Planned: Oral ETT  Additional Equipment:   Intra-op Plan:   Post-operative Plan: Extubation in OR  Informed Consent: I have reviewed the patients History and Physical, chart, labs and discussed the procedure including the risks, benefits and alternatives for the proposed anesthesia with the patient or authorized representative who has indicated his/her understanding and acceptance.   Dental Advisory Given and Dental advisory given  Plan Discussed with: CRNA and Surgeon  Anesthesia Plan Comments: (  Discussed general anesthesia, including possible nausea, instrumentation of airway, sore throat,pulmonary aspiration, etc. I asked if the were any outstanding questions, or  concerns before we proceeded. )        Anesthesia Quick Evaluation

## 2015-10-03 NOTE — Transfer of Care (Signed)
Immediate Anesthesia Transfer of Care Note  Patient: Samuel Herrera  Procedure(s) Performed: Procedure(s): OPEN REDUCTION INTERNAL FIXATION (ORIF) RIGHT HUMERUS FRACTURE WITH RADIAL NERVE NEUROLYSIS, REPAIR AND RECONTRUCT AS NECESSARY (Right)  Patient Location: PACU  Anesthesia Type:General  Level of Consciousness: awake, alert  and oriented  Airway & Oxygen Therapy: Patient Spontanous Breathing and Patient connected to nasal cannula oxygen  Post-op Assessment: Report given to RN and Post -op Vital signs reviewed and stable  Post vital signs: Reviewed and stable  Last Vitals:  Filed Vitals:   10/03/15 1314 10/03/15 1805  BP: 135/66 125/77  Pulse: 75   Temp: 36.8 C 36.4 C    Complications: No apparent anesthesia complications

## 2015-10-03 NOTE — Anesthesia Postprocedure Evaluation (Signed)
Anesthesia Post Note  Patient: DODD SCHMID  Procedure(s) Performed: Procedure(s) (LRB): OPEN REDUCTION INTERNAL FIXATION (ORIF) RIGHT HUMERUS FRACTURE WITH RADIAL NERVE NEUROLYSIS, REPAIR AND RECONTRUCT AS NECESSARY (Right)  Patient location during evaluation: PACU Anesthesia Type: General Level of consciousness: sedated, oriented, patient cooperative and responds to stimulation Pain management: pain level controlled Vital Signs Assessment: post-procedure vital signs reviewed and stable Respiratory status: spontaneous breathing, nonlabored ventilation, respiratory function stable and patient connected to nasal cannula oxygen Cardiovascular status: blood pressure returned to baseline and stable : nausea improved. Anesthetic complications: no    Last Vitals:  Filed Vitals:   10/03/15 1845 10/03/15 1900  BP: 121/66 128/68  Pulse: 74 77  Temp:    Resp: 12 16    Last Pain:  Filed Vitals:   10/03/15 1910  PainSc: Asleep                 Roddrick Sharron,E. Yareth Macdonnell

## 2015-10-03 NOTE — Progress Notes (Signed)
ANTIBIOTIC CONSULT NOTE - INITIAL  Pharmacy Consult for Vanco Indication: Post-op hand surgery  Allergies  Allergen Reactions  . Penicillins Hives    Patient Measurements: Height: 6' (182.9 cm) Weight: 185 lb (83.915 kg) IBW/kg (Calculated) : 77.6 Adjusted Body Weight:    Vital Signs: Temp: 98 F (36.7 C) (02/21 2019) Temp Source: Oral (02/21 2019) BP: 125/68 mmHg (02/21 2019) Pulse Rate: 82 (02/21 2019) Intake/Output from previous day:   Intake/Output from this shift: Total I/O In: 320 [P.O.:120; I.V.:200] Out: -   Labs:  Recent Labs  10/02/15 1331  WBC 9.8  HGB 14.8  PLT 368  CREATININE 0.94   Estimated Creatinine Clearance: 133 mL/min (by C-G formula based on Cr of 0.94). No results for input(s): VANCOTROUGH, VANCOPEAK, VANCORANDOM, GENTTROUGH, GENTPEAK, GENTRANDOM, TOBRATROUGH, TOBRAPEAK, TOBRARND, AMIKACINPEAK, AMIKACINTROU, AMIKACIN in the last 72 hours.   Microbiology: No results found for this or any previous visit (from the past 720 hour(s)).  Medical History: Past Medical History  Diagnosis Date  . Anxiety     panic attacks - overwhelming feeling of school work   . Headache     h/o migraine headaches     Assessment: 25 y/o M presents with Humerus fracture right upper extremity with radial nerve deficit.  2/21 radial nerve exploration and repair is necessary with associated ORIF right humerus fracture. No obvious infection noted or mentioned by MD/  PMH: anxiety, headaches, wrist surgery  Goal of Therapy:  Surgical prophx of infection  Plan:  Plan: Post-op prophylaxis Vancomycin 1g Iv q12 hrs x 2 doses.   Irvine Glorioso S. Merilynn Finland, PharmD, BCPS Clinical Staff Pharmacist Pager [phone]  Misty Stanley Stillinger 10/03/2015,10:19 PM

## 2015-10-03 NOTE — H&P (Signed)
Samuel Herrera is an 25 y.o. male.   Chief Complaint: Humerus fracture right upper extremity with radial nerve deficit HPI: Patient presents for evaluation and treatment of the of their upper extremity predicament. The patient denies neck, back, chest or  abdominal pain. The patient notes that they have no lower extremity problems. The patients primary complaint is noted. We are planning surgical care pathway for the upper extremity.  Past Medical History  Diagnosis Date  . Anxiety     panic attacks - overwhelming feeling of school work   . Headache     h/o migraine headaches     Past Surgical History  Procedure Laterality Date  . Wrist surgery      finger - grafting     No family history on file. Social History:  reports that he has never smoked. He does not have any smokeless tobacco history on file. He reports that he drinks alcohol. He reports that he uses illicit drugs (Marijuana).  Allergies:  Allergies  Allergen Reactions  . Penicillins Hives    Medications Prior to Admission  Medication Sig Dispense Refill  . oxyCODONE-acetaminophen (PERCOCET/ROXICET) 5-325 MG tablet Take 2 tablets by mouth every 4 (four) hours as needed for severe pain. 36 tablet 0  . pseudoephedrine (SUDAFED) 30 MG tablet Take 30 mg by mouth every 4 (four) hours as needed for congestion.    Marland Kitchen amphetamine-dextroamphetamine (ADDERALL XR) 20 MG 24 hr capsule Take 20 mg by mouth daily.      Results for orders placed or performed during the hospital encounter of 10/02/15 (from the past 48 hour(s))  Basic metabolic panel     Status: None   Collection Time: 10/02/15  1:31 PM  Result Value Ref Range   Sodium 141 135 - 145 mmol/L   Potassium 4.1 3.5 - 5.1 mmol/L   Chloride 104 101 - 111 mmol/L   CO2 24 22 - 32 mmol/L   Glucose, Bld 98 65 - 99 mg/dL   BUN 14 6 - 20 mg/dL   Creatinine, Ser 0.94 0.61 - 1.24 mg/dL   Calcium 10.0 8.9 - 10.3 mg/dL   GFR calc non Af Amer >60 >60 mL/min   GFR calc Af Amer >60  >60 mL/min    Comment: (NOTE) The eGFR has been calculated using the CKD EPI equation. This calculation has not been validated in all clinical situations. eGFR's persistently <60 mL/min signify possible Chronic Kidney Disease.    Anion gap 13 5 - 15  CBC     Status: None   Collection Time: 10/02/15  1:31 PM  Result Value Ref Range   WBC 9.8 4.0 - 10.5 K/uL   RBC 4.79 4.22 - 5.81 MIL/uL   Hemoglobin 14.8 13.0 - 17.0 g/dL   HCT 41.9 39.0 - 52.0 %   MCV 87.5 78.0 - 100.0 fL   MCH 30.9 26.0 - 34.0 pg   MCHC 35.3 30.0 - 36.0 g/dL   RDW 12.1 11.5 - 15.5 %   Platelets 368 150 - 400 K/uL   No results found.  Review of Systems  Respiratory: Negative.   Genitourinary: Negative.   Neurological: Negative.   Endo/Heme/Allergies: Negative.   Psychiatric/Behavioral: Negative.     Blood pressure 135/66, pulse 75, temperature 98.3 F (36.8 C), temperature source Oral, height 6' (1.829 m), weight 83.915 kg (185 lb), SpO2 100 %. Physical Exam  Mid shaft humerus fracture with radial nerve deficit  2 weeks out from injury.  Intact refill no evidence  of compartment syndrome skin is stable for surgical reconstruction  The patient is alert and oriented in no acute distress. The patient complains of pain in the affected upper extremity.  The patient is noted to have a normal HEENT exam. Lung fields show equal chest expansion and no shortness of breath. Abdomen exam is nontender without distention. Lower extremity examination does not show any fracture dislocation or blood clot symptoms. Pelvis is stable and the neck and back are stable and nontender. Assessment/Plan Plan for radial nerve exploration and repair is necessary with associated ORIF right humerus fracture  We are planning surgery for your upper extremity. The risk and benefits of surgery to include risk of bleeding, infection, anesthesia,  damage to normal structures and failure of the surgery to accomplish its intended goals of  relieving symptoms and restoring function have been discussed in detail. With this in mind we plan to proceed. I have specifically discussed with the patient the pre-and postoperative regime and the dos and don'ts and risk and benefits in great detail. Risk and benefits of surgery also include risk of dystrophy(CRPS), chronic nerve pain, failure of the healing process to go onto completion and other inherent risks of surgery The relavent the pathophysiology of the disease/injury process, as well as the alternatives for treatment and postoperative course of action has been discussed in great detail with the patient who desires to proceed.  We will do everything in our power to help you (the patient) restore function to the upper extremity. It is a pleasure to see this patient today.  Paulene Floor, MD 10/03/2015, 3:05 PM

## 2015-10-03 NOTE — Op Note (Signed)
See dict #[id number] Amanda Pea MD

## 2015-10-04 ENCOUNTER — Encounter (HOSPITAL_COMMUNITY): Payer: Self-pay | Admitting: Orthopedic Surgery

## 2015-10-04 DIAGNOSIS — S42301A Unspecified fracture of shaft of humerus, right arm, initial encounter for closed fracture: Secondary | ICD-10-CM | POA: Diagnosis not present

## 2015-10-04 NOTE — Op Note (Signed)
NAMEMALIK, PAAR NO.:  [id number]  MEDICAL RECORD NO.:  [id number]  LOCATION:                                 FACILITY:  PHYSICIAN:  Dionne Ano. Artie Takayama, M.D.DATE OF BIRTH:  [birth year]  DATE OF PROCEDURE: DATE OF DISCHARGE:                              OPERATIVE REPORT   PREOPERATIVE DIAGNOSIS:  __________ with a significant radial nerve palsy/injury.  POSTOPERATIVE DIAGNOSIS:  __________ with a significant radial nerve palsy/injury.  PROCEDURE:  Open reduction and internal fixation, with a 10 hole Zimmer low contact, large plate applied in compression mode, left humerus with autologous bone graft obtained from surrounding callus   Dictation ended at this point     Dionne Ano. Amanda Pea, M.D.     [facility]  D:  10/03/2015  T:  10/03/2015  Job:  [id number]

## 2015-10-04 NOTE — Op Note (Signed)
Samuel Herrera, Samuel Herrera  MEDICAL RECORD NO.:  [id number]  LOCATION:                                 FACILITY:  PHYSICIAN:  Dionne Ano. Natallie Ravenscroft, M.D.DATE OF BIRTH:  Dec 04, 1990  DATE OF PROCEDURE: DATE OF DISCHARGE:                              OPERATIVE REPORT   PREOPERATIVE DIAGNOSIS:  Right displaced comminuted transverse midshaft humerus fracture with an acute radial nerve palsy/injury.  POSTOPERATIVE DIAGNOSIS:  Right displaced comminuted transverse midshaft humerus fracture with an acute radial nerve palsy/injury.  PROCEDURE: 1. Open reduction and internal fixation, with autologous bone graft     obtained from early callous formation, right humerus.  We used a     Zimmer 10-hole large plate sized up from the 3.5.  This was low     contact large plate off the Zimmer set. 2. Radial nerve neurolysis extensive with radial nerve decompression,     right arm. 3. Collagen wrap 7 x 20 mm nerve wrap, area of injury about the     fracture site as described below. 4. AP lateral and oblique x-rays performed, examined, and interpreted     by myself, right arm.  SURGEON:  Dionne Ano. Amanda Pea, MD  ASSISTANT:  Karie Chimera, PA-C  COMPLICATIONS:  None.  ANESTHESIA:  General.  TOURNIQUET TIME:  Less than an hour.  DRAINS:  One.  INDICATIONS:  Patient is a 25 year old male with history of acute transverse fracture with radial nerve injury.  He has a significant radial nerve dysfunction.  I saw the patient late last week and prepped him for surgery as soon as possible.  He has had a radial nerve deficit and a fracture which appears to be comminuted and distracted.  It was unfortunately transverse and he was having an excruciating amount of pain in the hand due to the radial nerve injury.  I have discussed with him risks and benefits, do's and don'ts, timeframe, duration of recovery, and with all issues in mind, we will proceed  accordingly.  OPERATION IN DETAIL:  The patient was seen by myself and Anesthesia, taken to the operative field and underwent smooth induction of general anesthesia.  Preoperative antibiotics were given.  Time-out called.  The arm was prepped with Hibiclens scrub x2 followed by 10 minutes surgical scrub performed by myself.  Sterile field was secured.  Time-out called. I then placed an Esmarch tourniquet on the patient and made an anterior lateral approach to the humerus.  I knew that we would have to perform radial nerve decompression and thus at this time, I performed a very careful and cautious look at the radial nerve distally.  I picked it up between the brachialis and the brachioradialis region and then tracked it proximally.  With very careful and cautious dissection, I tagged it with a vessel loop.  I used a facial nerve dissector for this.  I accessed the anterior lateral approach with sweeping the biceps medially and incising the brachialis muscle.  I accessed the fracture site and at this time, performed a complete radial nerve decompression up to the fracture site.  At this time, I took down the 2 fracture ends, there  was 1 piece of floating bone which was comminuted, this was saved and later keyed into the puzzle.  I took down some callus and saved this for autologous bone grafting.  The anterior lateral approach allowed me to expose both bony ends. Unfortunately, the nerve was badly injured and I did perform an extensive radial nerve decompression and an extensive radial nerve neurolysis.  I took intraoperative photos of the nerve and noted that there was a large amount of ecchymosis and injury at the fracture site. Unfortunately, this nerve was encased in the fracture fragments and thus I very carefully with facial nerve dissection and 4.0 loupe magnification, dissected it out, removed all of the early callus and bony fragments from the fracture itself, so that the nerve  would be tension free.  Following this, I placed a 7 x 20 mm nerve wrap around the nerve to prevent scar tissue and hopefully to aid in healing, this was prepared in usual standard fashion with pre-soaking.  Following this, I then reduced the fracture.  We considered a 3.5 DCP plate; however a low contour plate sized up from this fit quite nicely from the Zimmer set.  I applied this plate in compression mode and the fracture interdigitated beautifully.  This was an excellent fixation.  The patient had a 10-hole plate applied with 8 cortices proximally and 8 cortices distally.  He lined up perfectly, had excellent range of motion.  We irrigated copiously and placed autologous bone graft in the jigsaw, keyed in pieces together with the plate application prior to the 1st screws.  Thus, the fracture interdigitated perfectly, in compression mode, it looked excellent.  Now, I was quite pleased with this. Following this, we then very carefully and cautiously irrigated with 2 L of saline.  I closed the brachialis after taking the final photo with phone application.  He tolerated this quite nicely and there were no complicating features.  The brachialis was repaired.  Final copy x-rays were taken.  Drain was placed in the form of a Hemovac drain and following this, wound was closed in layers with 3-0 Vicryl and interrupted 4-0 Prolene.  Mepilex dressing followed by a standard sterile long-arm splint was applied.  He was taken to recovery room in stable condition.  All sponge and needle counts were reported as correct.  He will be monitored in recovery room. We will admit him overnight for IV antibiotics, general postop observation.  These notes have been discussed and all questions have been encouraged and answered.     Dionne Ano. Amanda Pea, M.D.     [facility]  D:  10/03/2015  T:  10/03/2015  Job:  [id number]

## 2015-10-04 NOTE — Progress Notes (Signed)
Orthopedic Tech Progress Note Patient Details:  Samuel Herrera 06/15/1991 [id number]  Patient ID: Samuel Herrera, male   DOB: [birth year], 25 y.o.   MRN: [id number] One ortho visit and one arm sling is to be deleted  Nikki Dom 10/04/2015, 10:26 AM

## 2015-10-04 NOTE — Progress Notes (Signed)
Orthopedic Tech Progress Note Patient Details:  Samuel Herrera 04-16-1991 [id number]  Ortho Devices Type of Ortho Device: Arm sling Ortho Device/Splint Location: rue Ortho Device/Splint Interventions: Application Arm sling times 2  Nikki Dom 10/04/2015, 10:23 AM

## 2015-10-04 NOTE — Evaluation (Signed)
Occupational Therapy Evaluation and Discharge Patient Details Name: Samuel Herrera MRN: [id number] DOB: 1991/04/14 Today's Date: 10/04/2015    History of Present Illness Rt ORIF of humerus with Radial nerve neurolysis extensive with radial nerve decompression   Clinical Impression   This 25 yo male admitted with above presents to acute OT with all education completed with pt and mother, no further OT needs, we will sign off.    Follow Up Recommendations  No OT follow up    Equipment Recommendations  None recommended by OT       Precautions / Restrictions Precautions Precautions: Fall Restrictions Weight Bearing Restrictions: Yes RUE Weight Bearing: Non weight bearing      Mobility Bed Mobility Overal bed mobility: Modified Independent             General bed mobility comments: sit straight up in bed and then swings legs around  Transfers Overall transfer level: Needs assistance Equipment used:  (pushing IV pole) Transfers: Sit to/from Stand Sit to Stand: Min guard         General transfer comment: min guard A to ambulate 150 feet as well pushing IV pole         ADL                                         General ADL Comments: Pt reports he was managing prrior to sx with t-shirts and pants with elastic waist wtih help prn and plans on this post D/C as well. He was aware of the proper sequence of dressing UB to help protect the arm more. He is aware that if he showers he needs to keep arm dry (double bag/tape technique or get a long arm cast cover. Overall currently pt is at a Mod A for basic ADLs due to pain and new casting--family/girlfriend will A prn.               Pertinent Vitals/Pain Pain Assessment: 0-10 Pain Score: 6  Pain Location: RUE Pain Descriptors / Indicators: Aching;Sore;Guarding Pain Intervention(s): Repositioned;Monitored during session     Hand Dominance Left   Extremity/Trunk Assessment Upper Extremity  Assessment Upper Extremity Assessment: RUE deficits/detail RUE Deficits / Details: able to move digits within constraints of casting. Orginally told him to do gentle PROM/AAROM of shoulder but after speaking with Samuel Herrera [facility] for Dr Samuel Herrera) I re-edcuated pt not to do these that they would start these with them later. RUE Coordination: decreased fine motor;decreased gross motor           Communication Communication Communication: No difficulties   Cognition Arousal/Alertness: Awake/alert Behavior During Therapy: WFL for tasks assessed/performed Overall Cognitive Status: Within Functional Limits for tasks assessed                        Exercises   Other Exercises Other Exercises: edcated pt and mother on movement of digits and propping of arm for decrease of swelling. Had ortho tech bring him an extra large sling (large too small)        Home Living Family/patient expects to be discharged to:: Private residence Living Arrangements:  (girlfriend works during day; very supportive mother) Available Help at Discharge: Family;Available PRN/intermittently Type of Home: House Home Access: Stairs to enter [employer] of Steps: 3 Entrance Stairs-Rails: Left Home Layout: Two level Alternate Level Stairs-Number of Steps: 12 Alternate Level  Stairs-Rails: Right Bathroom Shower/Tub: Tub/shower unit (has been taking sponge baths)                    Prior Functioning/Environment Level of Independence: Independent             OT Diagnosis: Generalized weakness;Acute pain         OT Goals(Current goals can be found in the care plan section) Acute Rehab OT Goals Patient Stated Goal: home tomorrow with better pain control  OT Frequency:                End of Session Equipment Utilized During Treatment: Gait belt (sling) Nurse Communication: Mobility status  Activity Tolerance: Patient tolerated treatment well Patient left: in chair;with call  bell/phone within reach;with family/visitor present   Time: 0900-1006 OT Time Calculation (min): 66 min Charges:  OT General Charges $OT Visit: 1 Procedure OT Evaluation $OT Eval Moderate Complexity: 1 Procedure OT Treatments $Self Care/Home Management : 23-37 mins $Therapeutic Exercise: 8-22 mins G-Codes: OT G-codes **NOT FOR INPATIENT CLASS** Functional Assessment Tool Used: clinical observation Functional Limitation: Self care Self Care Current Status (X3244): At least 40 percent but less than 60 percent impaired, limited or restricted Self Care Goal Status (W1027): At least 40 percent but less than 60 percent impaired, limited or restricted Self Care Discharge Status [id number]): At least 40 percent but less than 60 percent impaired, limited or restricted  Samuel Herrera [phone] 10/04/2015, 1:51 PM

## 2015-10-04 NOTE — Evaluation (Signed)
Physical Therapy Evaluation and Discharge Patient Details Name: Samuel Herrera MRN: [id number] DOB: 1991-01-19 Today's Date: 10/04/2015   History of Present Illness  Rt ORIF of humerus with Radial nerve neurolysis extensive with radial nerve decompression  Clinical Impression  Patient evaluated by Physical Therapy with no further acute PT needs identified. All education has been completed and the patient has no further questions.  See below for any follow-up Physical Therapy or equipment needs. PT is signing off. Thank you for this referral.     Follow Up Recommendations Outpatient PT (or OT for shoulder; The potential need for Outpatient PT/OT can be addressed at Ortho follow-up appointments. )    Equipment Recommendations  None recommended by PT    Recommendations for Other Services       Precautions / Restrictions Precautions Precautions: Fall Restrictions Weight Bearing Restrictions: Yes RUE Weight Bearing: Non weight bearing      Mobility  Bed Mobility Overal bed mobility: Modified Independent             General bed mobility comments: sit straight up in bed and then swings legs around  Transfers Overall transfer level: Needs assistance Equipment used: None Transfers: Sit to/from Stand Sit to Stand: Supervision         General transfer comment: Smooth transition  Ambulation/Gait Ambulation/Gait assistance: Supervision;Modified independent (Device/Increase time) Ambulation Distance (Feet): 250 Feet Assistive device: None Gait Pattern/deviations: WFL(Within Functional Limits)     General Gait Details: Cues to self-monitor for activity tolerance; no loss of balance noted  Stairs Stairs: Yes Stairs assistance: Min guard Stair Management: No rails;Forwards;Alternating pattern Number of Stairs: 12 General stair comments: no difficulty noted  Wheelchair Mobility    Modified Rankin (Stroke Patients Only)       Balance                                              Pertinent Vitals/Pain Pain Assessment: 0-10 Pain Score: 2  Pain Location: RUE; reports pain is minimal when he is walking carefully Pain Descriptors / Indicators: Aching;Sore Pain Intervention(s): Monitored during session    Home Living Family/patient expects to be discharged to:: Private residence Living Arrangements:  (girlfriend works during day; very supportive mother) Available Help at Discharge: Family;Available PRN/intermittently Type of Home: House Home Access: Stairs to enter Entrance Stairs-Rails: Left Entrance Stairs-Number of Steps: 3 Home Layout: Two level        Prior Function Level of Independence: Independent               Hand Dominance   Dominant Hand: Left    Extremity/Trunk Assessment   Upper Extremity Assessment: Defer to OT evaluation RUE Deficits / Details: able to move digits within constraints of casting. Orginally told him to do gentle PROM/AAROM of shoulder but after speaking with [facility] for Dr Amedeo Plenty) I re-edcuated pt not to do these that they would start these with them later.         Lower Extremity Assessment: Overall WFL for tasks assessed         Communication   Communication: No difficulties  Cognition Arousal/Alertness: Awake/alert Behavior During Therapy: WFL for tasks assessed/performed Overall Cognitive Status: Within Functional Limits for tasks assessed                      General Comments  Exercises Other Exercises Other Exercises: edcated pt and mother on movement of digits and propping of arm for decrease of swelling. Had ortho tech bring him an extra large sling (large too small)      Assessment/Plan    PT Assessment All further PT needs can be met in the next venue of care  PT Diagnosis Difficulty walking   PT Problem List Decreased strength;Decreased range of motion;Decreased activity tolerance;Pain  PT Treatment Interventions     PT Goals (Current  goals can be found in the Care Plan section) Acute Rehab PT Goals Patient Stated Goal: home tomorrow with better pain control PT Goal Formulation: All assessment and education complete, DC therapy    Frequency     Barriers to discharge        Co-evaluation               End of Session   Activity Tolerance: Patient tolerated treatment well Patient left: in bed;with call bell/phone within reach;with family/visitor present Nurse Communication: Mobility status         Time: 1450-1502 PT Time Calculation (min) (ACUTE ONLY): 12 min   Charges:   PT Evaluation $PT Eval Low Complexity: 1 Procedure     PT G CodesQuin Hoop 10/04/2015, 3:24 PM  Roney Marion, [place] Pager [phone] Office [phone]

## 2015-10-04 NOTE — Progress Notes (Signed)
Subjective: 1 Day Post-Op Procedure(s) (LRB): OPEN REDUCTION INTERNAL FIXATION (ORIF) RIGHT HUMERUS FRACTURE WITH RADIAL NERVE NEUROLYSIS, REPAIR AND RECONTRUCT AS NECESSARY (Right) Patient with postoperative pain as expected this juncture. He denies any nausea, vomiting, fever or chills. His mother is in the room with him.   Objective: Vital signs in last 24 hours: Temp:  [97.6 F (36.4 C)-98.3 F (36.8 C)] 98 F (36.7 C) (02/22 0551) Pulse Rate:  [74-98] 93 (02/22 0551) Resp:  [10-20] 18 (02/22 0551) BP: (111-135)/(63-78) 123/63 mmHg (02/22 0551) SpO2:  [97 %-100 %] 97 % (02/22 0551) Weight:  [83.915 kg (185 lb)] 83.915 kg (185 lb) (02/21 1314)  Intake/Output from previous day: 02/21 0701 - 02/22 0700 In: 1320 [P.O.:120; I.V.:1200] Out: 0  Intake/Output this shift:     Recent Labs  10/02/15 1331  HGB 14.8    Recent Labs  10/02/15 1331  WBC 9.8  RBC 4.79  HCT 41.9  PLT 368    Recent Labs  10/02/15 1331  NA 141  K 4.1  CL 104  CO2 24  BUN 14  CREATININE 0.94  GLUCOSE 98  CALCIUM 10.0   No results for input(s): LABPT, INR in the last 72 hours.   physical examination  Patient is awake, alert and oriented, appropriate and conversant.  Right upper extremity : Drain is removed without difficulties, splint is clean dry and intact., Digits had excellent refill, there is no signs of infection present., Gross sensation is intact , radial nerve deficit as noted  The patient is alert and oriented in no acute distress. The patient complains of pain in the affected upper extremity.  The patient is noted to have a normal HEENT exam. Lung fields show equal chest expansion and no shortness of breath. Abdomen exam is nontender without distention. Lower extremity examination does not show any fracture dislocation or blood clot symptoms. Pelvis is stable and the neck and back are stable and nontender. Assessment/Plan: 1 Day Post-Op Procedure(s) (LRB): OPEN REDUCTION  INTERNAL FIXATION (ORIF) RIGHT HUMERUS FRACTURE WITH RADIAL NERVE NEUROLYSIS, REPAIR AND RECONTRUCT AS NECESSARY (Right) We'll continue to monitor patient today and work on a pain control. I have discussed with the and his mother weaning off of the the pain medication today progressing to more of a regime of by mouth pain medications, therapy today ambulating today and looking towards discharge tomorrow. We discussed all issues with the patient and his mother over 30 minutes duration today. Patient was encouraged and answered   Samuel Herrera 10/04/2015, 8:39 AM

## 2015-10-05 DIAGNOSIS — S42301A Unspecified fracture of shaft of humerus, right arm, initial encounter for closed fracture: Secondary | ICD-10-CM | POA: Diagnosis not present

## 2015-10-05 MED ORDER — OXYCODONE HCL 5 MG PO TABS: 5.0000 mg | ORAL_TABLET | ORAL | Status: DC | PRN

## 2015-10-05 MED ORDER — METHOCARBAMOL 500 MG PO TABS: 500.0000 mg | ORAL_TABLET | Freq: Four times a day (QID) | ORAL | Status: DC | PRN

## 2015-10-05 NOTE — Progress Notes (Signed)
Pt discharge education and instructions completed with pt and mother at bedside; both voices understanding and denies any questions. Pt IV removed; pt discharge home with mother to transport him home. Pt incision dsg remains clean dry and intact with sling on. Pt handed his prescriptions for robaxin, oxycodone and zofran. Pt transported off unit via wheelchair with mother and belongings. Dionne Bucy RN

## 2015-10-05 NOTE — Discharge Summary (Signed)
Physician Discharge Summary  Patient ID: Samuel Herrera MRN: [id number] DOB/AGE: May 16, 1991 25 y.o.  Admit date: 10/03/2015 Discharge date:   Admission Diagnoses: RIGHT HUMERUS FRACTURE WITH RADIAL NERVE PALSY Past Medical History  Diagnosis Date  . Anxiety     panic attacks - overwhelming feeling of school work   . Headache     h/o migraine headaches     Discharge Diagnoses:  Active Problems:   Fracture closed, humerus, shaft   Surgeries: Procedure(s): OPEN REDUCTION INTERNAL FIXATION (ORIF) RIGHT HUMERUS FRACTURE WITH RADIAL NERVE NEUROLYSIS, REPAIR AND RECONTRUCT AS NECESSARY on 10/03/2015    Consultants:   none  Discharged Condition: Improved  Hospital Course: [name] is an 25 y.o. male who was admitted 10/03/2015 with a chief complaint of No chief complaint on file. , and found to have a diagnosis of RIGHT HUMERUS FRACTURE WITH RADIAL NERVE PALSY.  They were brought to the operating room on 10/03/2015 and underwent Procedure(s): OPEN REDUCTION INTERNAL FIXATION (ORIF) RIGHT HUMERUS FRACTURE WITH RADIAL NERVE NEUROLYSIS, REPAIR AND RECONTRUCT AS NECESSARY.    They were given perioperative antibiotics: Anti-infectives    Start     Dose/Rate Route Frequency Ordered Stop   10/04/15 0300  vancomycin (VANCOCIN) IVPB 1000 mg/200 mL premix     1,000 mg 200 mL/hr over 60 Minutes Intravenous Every 12 hours 10/03/15 2220 10/06/15 0259   10/03/15 1230  vancomycin (VANCOCIN) IVPB 1000 mg/200 mL premix     1,000 mg 200 mL/hr over 60 Minutes Intravenous To ShortStay [facility] 10/02/15 0925 10/03/15 1526    .  They were given sequential compression devices, early ambulation.  Recent vital signs: Patient Vitals for the past 24 hrs:  BP Temp Temp src Pulse Resp SpO2  10/05/15 0515 120/67 mmHg 98.5 F (36.9 C) Oral 97 18 97 %  10/04/15 1935 (!) 142/90 mmHg 97.9 F (36.6 C) Oral 95 18 95 %  10/04/15 1355 122/71 mmHg 98.3 F (36.8 C) Oral 95 16 98 %  .  Recent laboratory  studies: No results found.  Discharge Medications:     Medication List    STOP taking these medications        oxyCODONE-acetaminophen 5-325 MG tablet  Commonly known as:  PERCOCET/ROXICET      TAKE these medications        amphetamine-dextroamphetamine 20 MG 24 hr capsule  Commonly known as:  ADDERALL XR  Take 20 mg by mouth daily.     methocarbamol 500 MG tablet  Commonly known as:  ROBAXIN  Take 1 tablet (500 mg total) by mouth every 6 (six) hours as needed for muscle spasms.     oxyCODONE 5 MG immediate release tablet  Commonly known as:  Oxy IR/ROXICODONE  Take 1-2 tablets (5-10 mg total) by mouth every 3 (three) hours as needed for moderate pain.     pseudoephedrine 30 MG tablet  Commonly known as:  SUDAFED  Take 30 mg by mouth every 4 (four) hours as needed for congestion.        Diagnostic Studies: Ct Head Wo Contrast  09/13/2015  CLINICAL DATA:  Fall downstairs with right humerus fracture. Initial encounter. EXAM: CT HEAD WITHOUT CONTRAST CT CERVICAL SPINE WITHOUT CONTRAST TECHNIQUE: Multidetector CT imaging of the head and cervical spine was performed following the standard protocol without intravenous contrast. Multiplanar CT image reconstructions of the cervical spine were also generated. COMPARISON:  None. FINDINGS: CT HEAD FINDINGS Skull and Sinuses:Negative for fracture. Smooth low-density masses in the bilateral  maxillary sinus compatible with mucous retention cysts. These are large but there is \\nNo  postobstructive fluid. Visualized orbits: Negative. Brain: Normal. No infarction, hemorrhage, hydrocephalus, or mass lesion/mass effect. CT CERVICAL SPINE FINDINGS Negative for acute fracture or subluxation. No prevertebral edema. No gross cervical canal hematoma. IMPRESSION: No evidence of intracranial or cervical spine injury. Electronically Signed   By: Marnee Spring M.D.   On: 09/13/2015 06:47   Ct Cervical Spine Wo Contrast  09/13/2015  CLINICAL DATA:  Fall  downstairs with right humerus fracture. Initial encounter. EXAM: CT HEAD WITHOUT CONTRAST CT CERVICAL SPINE WITHOUT CONTRAST TECHNIQUE: Multidetector CT imaging of the head and cervical spine was performed following the standard protocol without intravenous contrast. Multiplanar CT image reconstructions of the cervical spine were also generated. COMPARISON:  None. FINDINGS: CT HEAD FINDINGS Skull and Sinuses:Negative for fracture. Smooth low-density masses in the bilateral maxillary sinus compatible with mucous retention cysts. These are large but there is \\nNo  postobstructive fluid. Visualized orbits: Negative. Brain: Normal. No infarction, hemorrhage, hydrocephalus, or mass lesion/mass effect. CT CERVICAL SPINE FINDINGS Negative for acute fracture or subluxation. No prevertebral edema. No gross cervical canal hematoma. IMPRESSION: No evidence of intracranial or cervical spine injury. Electronically Signed   By: Marnee Spring M.D.   On: 09/13/2015 06:47   Dg Pelvis Portable  09/13/2015  CLINICAL DATA:  Fall down stairs with pain in the right humerus. Initial encounter. EXAM: PORTABLE PELVIS 1-2 VIEWS COMPARISON:  None. FINDINGS: Artifact from the backboard. The iliac crests are partially excluded from view. Negative for pelvic ring fracture or diastasis. Polygonal structure overlapping the proximal left femur is likely external to the patient as there is no donor fracture site. IMPRESSION: Negative visualized pelvis. Electronically Signed   By: Marnee Spring M.D.   On: 09/13/2015 05:59   Dg Chest Portable 1 View  09/13/2015  CLINICAL DATA:  Fall downstairs with pain in the right humerus. Initial encounter. EXAM: PORTABLE CHEST 1 VIEW COMPARISON:  None. FINDINGS: Artifact over the chest from back board and EKG leads. Normal heart size and mediastinal contours. No acute infiltrate or edema. No effusion or pneumothorax. No acute osseous findings. IMPRESSION: Negative portable chest. Electronically Signed   By:  Marnee Spring M.D.   On: 09/13/2015 05:50   Dg Humerus Right  09/13/2015  CLINICAL DATA:  Fall down stairs with pain in the humerus. Initial encounter. EXAM: RIGHT HUMERUS - 2+ VIEW COMPARISON:  None. FINDINGS: Predominately transverse fracture through the humeral diaphysis with 100% lateral displacement and 16 mm of fracture over riding. Comminution present around the fracture margin. No evidence of dislocation. IMPRESSION: Displaced and over riding humeral diaphysis fracture. Electronically Signed   By: Marnee Spring M.D.   On: 09/13/2015 06:00    They benefited maximally from their hospital stay and there were no complications.     Disposition: 01-Home or Self Care     Discharge Instructions    Call MD / Call 911    Complete by:  As directed   If you experience chest pain or shortness of breath, CALL 911 and be transported to the hospital emergency room.  If you develope a fever above 101 F, pus (white drainage) or increased drainage or redness at the wound, or calf pain, call your surgeon's office.     Constipation Prevention    Complete by:  As directed   Drink plenty of fluids.  Prune juice may be helpful.  You may use a stool softener, such as Colace (over  the counter) 100 mg twice a day.  Use MiraLax (over the counter) for constipation as needed.     Diet - low sodium heart healthy    Complete by:  As directed      Discharge instructions    Complete by:  As directed   Keep bandage clean and dry.  Call for any problems.  No smoking.  Criteria for driving a car: you should be off your pain medicine for 7-8 hours, able to drive one handed(confident), thinking clearly and feeling able in your judgement to drive. Continue elevation as it will decrease swelling.  If instructed by MD move your fingers within the confines of the bandage/splint.  Use ice if instructed by your MD. Call immediately for any sudden loss of feeling in your hand/arm or change in functional abilities of the  extremity. We recommend that you to take vitamin C 1000 mg a day to promote healing. We also recommend that if you require  pain medicine that you take a stool softener to prevent constipation as most pain medicines will have constipation side effects. We recommend either Peri-Colace or Senokot and recommend that you also consider adding MiraLAX as well to prevent the constipation affects from pain medicine if you are required to use them. These medicines are over the counter and may be purchased at a local pharmacy. A cup of yogurt and a probiotic can also be helpful during the recovery process as the medicines can disrupt your intestinal environment.     Increase activity slowly as tolerated    Complete by:  As directed           Follow-up Information    Follow up with Karen Chafe, MD.   Specialty:  Orthopedic Surgery   Why:  Our office will contact you with follow-up information, date and time   Contact information:   [address] [place] [address] [phone]        Signed: Sheran Lawless 10/05/2015, 12:39 PM

## 2015-10-05 NOTE — Discharge Instructions (Signed)

## 2017-11-14 ENCOUNTER — Encounter (HOSPITAL_COMMUNITY): Admission: EM | Disposition: A | Discharge status: Home / Self Care | Payer: Self-pay | Source: Home / Self Care

## 2017-11-14 ENCOUNTER — Emergency Department (HOSPITAL_COMMUNITY): Payer: 59

## 2017-11-14 ENCOUNTER — Ambulatory Visit: Payer: Self-pay | Admitting: Emergency Medicine

## 2017-11-14 ENCOUNTER — Emergency Department (HOSPITAL_COMMUNITY): Payer: 59 | Admitting: Certified Registered"

## 2017-11-14 ENCOUNTER — Other Ambulatory Visit: Payer: Self-pay

## 2017-11-14 ENCOUNTER — Encounter (HOSPITAL_COMMUNITY): Payer: Self-pay | Admitting: *Deleted

## 2017-11-14 ENCOUNTER — Inpatient Hospital Stay (HOSPITAL_COMMUNITY)
Admission: EM | Admit: 2017-11-14 | Discharge: 2017-11-19 | DRG: 339 | Disposition: A | Discharge status: Home / Self Care | Payer: 59 | Attending: General Surgery | Admitting: General Surgery

## 2017-11-14 VITALS — BP 124/62 | HR 104 | Temp 99.4°F | Resp 22 | Wt 195.6 lb

## 2017-11-14 DIAGNOSIS — R1031 Right lower quadrant pain: Secondary | ICD-10-CM

## 2017-11-14 DIAGNOSIS — K3532 Acute appendicitis with perforation and localized peritonitis, without abscess: Principal | ICD-10-CM | POA: Diagnosis present

## 2017-11-14 DIAGNOSIS — K37 Unspecified appendicitis: Secondary | ICD-10-CM | POA: Diagnosis present

## 2017-11-14 DIAGNOSIS — K3580 Unspecified acute appendicitis: Secondary | ICD-10-CM

## 2017-11-14 DIAGNOSIS — K567 Ileus, unspecified: Secondary | ICD-10-CM | POA: Diagnosis present

## 2017-11-14 DIAGNOSIS — Z880 Allergy status to penicillin: Secondary | ICD-10-CM

## 2017-11-14 DIAGNOSIS — R300 Dysuria: Secondary | ICD-10-CM | POA: Diagnosis present

## 2017-11-14 DIAGNOSIS — R000 Tachycardia, unspecified: Secondary | ICD-10-CM | POA: Diagnosis present

## 2017-11-14 HISTORY — DX: Migraine, unspecified, not intractable, without status migrainosus: G43.909

## 2017-11-14 HISTORY — DX: Depression, unspecified: F32.A

## 2017-11-14 HISTORY — DX: Major depressive disorder, single episode, unspecified: F32.9

## 2017-11-14 HISTORY — PX: APPENDECTOMY: SHX54

## 2017-11-14 HISTORY — PX: LAPAROSCOPIC APPENDECTOMY: SHX408

## 2017-11-14 HISTORY — DX: Acute appendicitis with perforation, localized peritonitis, and gangrene, without abscess: K35.32

## 2017-11-14 HISTORY — DX: Personal history of traumatic brain injury: Z87.820

## 2017-11-14 LAB — COMPREHENSIVE METABOLIC PANEL
ALBUMIN: 4 g/dL (ref 3.5–5.0)
ALK PHOS: 52 U/L (ref 38–126)
ALT: 13 U/L — ABNORMAL LOW (ref 17–63)
ANION GAP: 9 (ref 5–15)
AST: 19 U/L (ref 15–41)
BUN: 14 mg/dL (ref 6–20)
CO2: 21 mmol/L — ABNORMAL LOW (ref 22–32)
Calcium: 9.6 mg/dL (ref 8.9–10.3)
Chloride: 105 mmol/L (ref 101–111)
Creatinine, Ser: 1 mg/dL (ref 0.61–1.24)
GFR calc non Af Amer: >60 mL/min (ref >60)
GLUCOSE: 129 mg/dL — AB (ref 65–99)
Potassium: 3.8 mmol/L (ref 3.5–5.1)
Sodium: 135 mmol/L (ref 135–145)
Total Bilirubin: 0.8 mg/dL (ref 0.3–1.2)
Total Protein: 7.2 g/dL (ref 6.5–8.1)

## 2017-11-14 LAB — CBC WITH DIFFERENTIAL/PLATELET
Basophils Absolute: 0 10*3/uL (ref 0.0–0.1)
Basophils Relative: 0 %
Eosinophils Absolute: 0 10*3/uL (ref 0.0–0.7)
Eosinophils Relative: 0 %
HEMATOCRIT: 41.8 % (ref 39.0–52.0)
Hemoglobin: 14.8 g/dL (ref 13.0–17.0)
Lymphocytes Relative: 7 %
Lymphs Abs: 1.1 10*3/uL (ref 0.7–4.0)
MCH: 31.2 pg (ref 26.0–34.0)
MCHC: 35.4 g/dL (ref 30.0–36.0)
MCV: 88 fL (ref 78.0–100.0)
MONO ABS: 1.1 10*3/uL — AB (ref 0.1–1.0)
Monocytes Relative: 7 %
NEUTROS ABS: 13.3 10*3/uL — AB (ref 1.7–7.7)
Neutrophils Relative %: 86 %
PLATELETS: 325 10*3/uL (ref 150–400)
RBC: 4.75 MIL/uL (ref 4.22–5.81)
RDW: 12.2 % (ref 11.5–15.5)
WBC: 15.5 10*3/uL — ABNORMAL HIGH (ref 4.0–10.5)

## 2017-11-14 LAB — LIPASE, BLOOD: Lipase: 25 U/L (ref 11–51)

## 2017-11-14 LAB — I-STAT CG4 LACTIC ACID, ED
Lactic Acid, Venous: 2.07 mmol/L (ref 0.5–1.9)
Lactic Acid, Venous: 1.39 mmol/L (ref 0.5–1.9)

## 2017-11-14 SURGERY — APPENDECTOMY, LAPAROSCOPIC
Anesthesia: General | Site: Abdomen

## 2017-11-14 MED ORDER — HYDROMORPHONE HCL 1 MG/ML IJ SOLN
0.2500 mg | INTRAMUSCULAR | Status: DC | PRN
  Administered 2017-11-14 (×4): 0.5 mg via INTRAVENOUS

## 2017-11-14 MED ORDER — CIPROFLOXACIN IN D5W 400 MG/200ML IV SOLN
400.0000 mg | Freq: Two times a day (BID) | INTRAVENOUS | Status: DC
  Administered 2017-11-14 – 2017-11-19 (×11): 400 mg via INTRAVENOUS
  Filled 2017-11-14 (×11): qty 200

## 2017-11-14 MED ORDER — HYDROMORPHONE HCL 1 MG/ML IJ SOLN
INTRAMUSCULAR | Status: AC
  Administered 2017-11-14: 0.5 mg via INTRAVENOUS
  Filled 2017-11-14: qty 1

## 2017-11-14 MED ORDER — PROPOFOL 10 MG/ML IV BOLUS
INTRAVENOUS | Status: DC | PRN
  Administered 2017-11-14: 180 mg via INTRAVENOUS

## 2017-11-14 MED ORDER — SUGAMMADEX SODIUM 500 MG/5ML IV SOLN
INTRAVENOUS | Status: AC
  Filled 2017-11-14: qty 5

## 2017-11-14 MED ORDER — HYDROMORPHONE HCL 1 MG/ML IJ SOLN
INTRAMUSCULAR | Status: DC | PRN
  Administered 2017-11-14: 0.5 mg via INTRAVENOUS

## 2017-11-14 MED ORDER — FENTANYL CITRATE (PF) 250 MCG/5ML IJ SOLN
INTRAMUSCULAR | Status: AC
  Filled 2017-11-14: qty 5

## 2017-11-14 MED ORDER — MORPHINE SULFATE (PF) 4 MG/ML IV SOLN
INTRAVENOUS | Status: AC
  Administered 2017-11-14: 2 mg via INTRAVENOUS
  Filled 2017-11-14: qty 1

## 2017-11-14 MED ORDER — IOPAMIDOL (ISOVUE-300) INJECTION 61%
100.0000 mL | Freq: Once | INTRAVENOUS | Status: AC | PRN
  Administered 2017-11-14: 100 mL via INTRAVENOUS

## 2017-11-14 MED ORDER — SODIUM CHLORIDE 0.9 % IV SOLN: INTRAVENOUS | Status: DC

## 2017-11-14 MED ORDER — MEPERIDINE HCL 50 MG/ML IJ SOLN: 6.2500 mg | INTRAMUSCULAR | Status: DC | PRN

## 2017-11-14 MED ORDER — SODIUM CHLORIDE 0.9 % IV SOLN
INTRAVENOUS | Status: DC | PRN
  Administered 2017-11-14: 13:00:00 via INTRAVENOUS

## 2017-11-14 MED ORDER — MORPHINE SULFATE (PF) 4 MG/ML IV SOLN
2.0000 mg | INTRAVENOUS | Status: DC | PRN
  Administered 2017-11-14: 2 mg via INTRAVENOUS

## 2017-11-14 MED ORDER — ONDANSETRON 4 MG PO TBDP
4.0000 mg | ORAL_TABLET | Freq: Four times a day (QID) | ORAL | Status: DC | PRN
  Administered 2017-11-17: 4 mg via ORAL
  Filled 2017-11-14: qty 1

## 2017-11-14 MED ORDER — DEXAMETHASONE SODIUM PHOSPHATE 10 MG/ML IJ SOLN
INTRAMUSCULAR | Status: DC | PRN
  Administered 2017-11-14: 10 mg via INTRAVENOUS

## 2017-11-14 MED ORDER — PHENYLEPHRINE HCL 10 MG/ML IJ SOLN
INTRAMUSCULAR | Status: DC | PRN
  Administered 2017-11-14: 80 ug via INTRAVENOUS

## 2017-11-14 MED ORDER — EPHEDRINE 5 MG/ML INJ
INTRAVENOUS | Status: AC
  Filled 2017-11-14: qty 10

## 2017-11-14 MED ORDER — CIPROFLOXACIN IN D5W 400 MG/200ML IV SOLN
400.0000 mg | Freq: Two times a day (BID) | INTRAVENOUS | Status: DC
  Administered 2017-11-14: 400 mg via INTRAVENOUS
  Filled 2017-11-14 (×2): qty 200

## 2017-11-14 MED ORDER — HYDROMORPHONE HCL 1 MG/ML IJ SOLN
INTRAMUSCULAR | Status: AC
  Filled 2017-11-14: qty 0.5

## 2017-11-14 MED ORDER — ONDANSETRON 4 MG PO TBDP
4.0000 mg | ORAL_TABLET | Freq: Once | ORAL | Status: DC | PRN
  Filled 2017-11-14: qty 1

## 2017-11-14 MED ORDER — METRONIDAZOLE IN NACL 5-0.79 MG/ML-% IV SOLN
500.0000 mg | Freq: Three times a day (TID) | INTRAVENOUS | Status: DC
  Administered 2017-11-14: 500 mg via INTRAVENOUS
  Filled 2017-11-14 (×2): qty 100

## 2017-11-14 MED ORDER — MIDAZOLAM HCL 5 MG/5ML IJ SOLN
INTRAMUSCULAR | Status: DC | PRN
  Administered 2017-11-14: 2 mg via INTRAVENOUS

## 2017-11-14 MED ORDER — PROMETHAZINE HCL 25 MG/ML IJ SOLN: 6.2500 mg | INTRAMUSCULAR | Status: DC | PRN

## 2017-11-14 MED ORDER — SODIUM CHLORIDE 0.9 % IR SOLN
Status: DC | PRN
  Administered 2017-11-14: 1000 mL

## 2017-11-14 MED ORDER — SIMETHICONE 80 MG PO CHEW
40.0000 mg | CHEWABLE_TABLET | Freq: Four times a day (QID) | ORAL | Status: DC | PRN
  Administered 2017-11-15 (×2): 40 mg via ORAL
  Filled 2017-11-14 (×3): qty 1

## 2017-11-14 MED ORDER — KETOROLAC TROMETHAMINE 15 MG/ML IJ SOLN
15.0000 mg | Freq: Four times a day (QID) | INTRAMUSCULAR | Status: DC | PRN
  Administered 2017-11-15 – 2017-11-16 (×5): 15 mg via INTRAVENOUS
  Filled 2017-11-14 (×6): qty 1

## 2017-11-14 MED ORDER — LACTATED RINGERS IV SOLN: INTRAVENOUS | Status: DC

## 2017-11-14 MED ORDER — ONDANSETRON 4 MG PO TBDP: 4.0000 mg | ORAL_TABLET | Freq: Four times a day (QID) | ORAL | Status: DC | PRN

## 2017-11-14 MED ORDER — PROPOFOL 10 MG/ML IV BOLUS
INTRAVENOUS | Status: AC
Start: 2017-11-14 — End: ?
  Filled 2017-11-14: qty 20

## 2017-11-14 MED ORDER — ONDANSETRON HCL 4 MG/2ML IJ SOLN
4.0000 mg | Freq: Once | INTRAMUSCULAR | Status: AC
  Administered 2017-11-14: 4 mg via INTRAVENOUS
  Filled 2017-11-14: qty 2

## 2017-11-14 MED ORDER — LIDOCAINE HCL (CARDIAC) 20 MG/ML IV SOLN
INTRAVENOUS | Status: DC | PRN
  Administered 2017-11-14: 100 mg via INTRAVENOUS

## 2017-11-14 MED ORDER — MORPHINE SULFATE (PF) 4 MG/ML IV SOLN
4.0000 mg | Freq: Once | INTRAVENOUS | Status: AC
  Administered 2017-11-14: 4 mg via INTRAVENOUS
  Filled 2017-11-14: qty 1

## 2017-11-14 MED ORDER — ACETAMINOPHEN 10 MG/ML IV SOLN: 1000.0000 mg | Freq: Once | INTRAVENOUS | Status: DC | PRN

## 2017-11-14 MED ORDER — LACTATED RINGERS IV SOLN: INTRAVENOUS | Status: DC | PRN

## 2017-11-14 MED ORDER — ONDANSETRON HCL 4 MG/2ML IJ SOLN
4.0000 mg | Freq: Four times a day (QID) | INTRAMUSCULAR | Status: DC | PRN
  Administered 2017-11-15 – 2017-11-18 (×8): 4 mg via INTRAVENOUS
  Filled 2017-11-14 (×8): qty 2

## 2017-11-14 MED ORDER — LIDOCAINE HCL (CARDIAC) 20 MG/ML IV SOLN
INTRAVENOUS | Status: AC
  Filled 2017-11-14: qty 5

## 2017-11-14 MED ORDER — SUGAMMADEX SODIUM 200 MG/2ML IV SOLN
INTRAVENOUS | Status: AC
  Filled 2017-11-14: qty 2

## 2017-11-14 MED ORDER — FENTANYL CITRATE (PF) 100 MCG/2ML IJ SOLN
INTRAMUSCULAR | Status: DC | PRN
  Administered 2017-11-14: 150 ug via INTRAVENOUS
  Administered 2017-11-14 (×2): 50 ug via INTRAVENOUS

## 2017-11-14 MED ORDER — IOPAMIDOL (ISOVUE-300) INJECTION 61%
INTRAVENOUS | Status: AC
  Filled 2017-11-14: qty 100

## 2017-11-14 MED ORDER — ROCURONIUM BROMIDE 100 MG/10ML IV SOLN
INTRAVENOUS | Status: DC | PRN
  Administered 2017-11-14: 50 mg via INTRAVENOUS

## 2017-11-14 MED ORDER — HYDROCODONE-ACETAMINOPHEN 7.5-325 MG PO TABS: 1.0000 | ORAL_TABLET | Freq: Once | ORAL | Status: DC | PRN

## 2017-11-14 MED ORDER — ONDANSETRON HCL 4 MG/2ML IJ SOLN
INTRAMUSCULAR | Status: DC | PRN
  Administered 2017-11-14: 4 mg via INTRAVENOUS

## 2017-11-14 MED ORDER — KETOROLAC TROMETHAMINE 30 MG/ML IJ SOLN
INTRAMUSCULAR | Status: AC
  Administered 2017-11-14: 15 mg
  Filled 2017-11-14: qty 1

## 2017-11-14 MED ORDER — SODIUM CHLORIDE 0.9 % IV BOLUS
1000.0000 mL | Freq: Once | INTRAVENOUS | Status: AC
  Administered 2017-11-14: 1000 mL via INTRAVENOUS

## 2017-11-14 MED ORDER — OXYCODONE HCL 5 MG PO TABS
5.0000 mg | ORAL_TABLET | ORAL | Status: DC | PRN
  Administered 2017-11-15 – 2017-11-18 (×11): 10 mg via ORAL
  Filled 2017-11-14 (×13): qty 2

## 2017-11-14 MED ORDER — ENOXAPARIN SODIUM 40 MG/0.4ML ~~LOC~~ SOLN: 40.0000 mg | SUBCUTANEOUS | Status: DC

## 2017-11-14 MED ORDER — METRONIDAZOLE IN NACL 5-0.79 MG/ML-% IV SOLN
500.0000 mg | Freq: Three times a day (TID) | INTRAVENOUS | Status: DC
  Administered 2017-11-14 – 2017-11-19 (×15): 500 mg via INTRAVENOUS
  Filled 2017-11-14 (×16): qty 100

## 2017-11-14 MED ORDER — SODIUM CHLORIDE 0.9 % IV SOLN
INTRAVENOUS | Status: DC
  Administered 2017-11-14 – 2017-11-19 (×7): via INTRAVENOUS

## 2017-11-14 MED ORDER — BUPIVACAINE-EPINEPHRINE 0.25% -1:200000 IJ SOLN
INTRAMUSCULAR | Status: DC | PRN
  Administered 2017-11-14: 8 mL

## 2017-11-14 MED ORDER — ROCURONIUM BROMIDE 10 MG/ML (PF) SYRINGE
PREFILLED_SYRINGE | INTRAVENOUS | Status: AC
  Filled 2017-11-14: qty 5

## 2017-11-14 MED ORDER — METHOCARBAMOL 500 MG PO TABS
500.0000 mg | ORAL_TABLET | Freq: Four times a day (QID) | ORAL | Status: DC | PRN
  Administered 2017-11-17: 500 mg via ORAL
  Filled 2017-11-14 (×3): qty 1

## 2017-11-14 MED ORDER — MORPHINE SULFATE (PF) 4 MG/ML IV SOLN
2.0000 mg | INTRAVENOUS | Status: DC | PRN
  Administered 2017-11-14 – 2017-11-15 (×4): 2 mg via INTRAVENOUS
  Filled 2017-11-14 (×4): qty 1

## 2017-11-14 MED ORDER — 0.9 % SODIUM CHLORIDE (POUR BTL) OPTIME
TOPICAL | Status: DC | PRN
  Administered 2017-11-14: 1000 mL

## 2017-11-14 MED ORDER — MIDAZOLAM HCL 2 MG/2ML IJ SOLN
INTRAMUSCULAR | Status: AC
  Filled 2017-11-14: qty 2

## 2017-11-14 MED ORDER — BUPIVACAINE-EPINEPHRINE (PF) 0.25% -1:200000 IJ SOLN
INTRAMUSCULAR | Status: AC
  Filled 2017-11-14: qty 30

## 2017-11-14 MED ORDER — ONDANSETRON HCL 4 MG/2ML IJ SOLN: 4.0000 mg | Freq: Four times a day (QID) | INTRAMUSCULAR | Status: DC | PRN

## 2017-11-14 MED ORDER — SUGAMMADEX SODIUM 500 MG/5ML IV SOLN
INTRAVENOUS | Status: DC | PRN
  Administered 2017-11-14: 400 mg via INTRAVENOUS

## 2017-11-14 MED ORDER — ENOXAPARIN SODIUM 40 MG/0.4ML ~~LOC~~ SOLN
40.0000 mg | SUBCUTANEOUS | Status: DC
  Administered 2017-11-15 – 2017-11-18 (×4): 40 mg via SUBCUTANEOUS
  Filled 2017-11-14 (×4): qty 0.4

## 2017-11-14 SURGICAL SUPPLY — 41 items
APPLIER CLIP ROT 10 11.4 M/L (STAPLE)
CANISTER SUCT 3000ML PPV (MISCELLANEOUS) ×3 IMPLANT
CHLORAPREP W/TINT 26ML (MISCELLANEOUS) ×3 IMPLANT
CLIP APPLIE ROT 10 11.4 M/L (STAPLE) IMPLANT
CLOSURE WOUND 1/2 X4 (GAUZE/BANDAGES/DRESSINGS) ×1
COVER SURGICAL LIGHT HANDLE (MISCELLANEOUS) ×3 IMPLANT
CUTTER FLEX LINEAR 45M (STAPLE) ×3 IMPLANT
DERMABOND ADVANCED (GAUZE/BANDAGES/DRESSINGS) ×2
DERMABOND ADVANCED .7 DNX12 (GAUZE/BANDAGES/DRESSINGS) ×1 IMPLANT
DEVICE TROCAR PUNCTURE CLOSURE (ENDOMECHANICALS) ×3 IMPLANT
ELECT REM PT RETURN 9FT ADLT (ELECTROSURGICAL) ×3
ELECTRODE REM PT RTRN 9FT ADLT (ELECTROSURGICAL) ×1 IMPLANT
GLOVE BIO SURGEON STRL SZ7 (GLOVE) ×3 IMPLANT
GLOVE BIOGEL PI IND STRL 7.5 (GLOVE) ×1 IMPLANT
GLOVE BIOGEL PI INDICATOR 7.5 (GLOVE) ×2
GOWN STRL REUS W/ TWL LRG LVL3 (GOWN DISPOSABLE) ×3 IMPLANT
GOWN STRL REUS W/TWL LRG LVL3 (GOWN DISPOSABLE) ×6
KIT BASIN OR (CUSTOM PROCEDURE TRAY) ×3 IMPLANT
KIT TURNOVER KIT B (KITS) ×3 IMPLANT
NS IRRIG 1000ML POUR BTL (IV SOLUTION) ×3 IMPLANT
PAD ARMBOARD 7.5X6 YLW CONV (MISCELLANEOUS) ×6 IMPLANT
POUCH RETRIEVAL ECOSAC 10 (ENDOMECHANICALS) ×1 IMPLANT
POUCH RETRIEVAL ECOSAC 10MM (ENDOMECHANICALS) ×2
RELOAD 45 VASCULAR/THIN (ENDOMECHANICALS) IMPLANT
RELOAD STAPLE TA45 3.5 REG BLU (ENDOMECHANICALS) ×3 IMPLANT
SCISSORS LAP 5X35 DISP (ENDOMECHANICALS) IMPLANT
SET IRRIG TUBING LAPAROSCOPIC (IRRIGATION / IRRIGATOR) ×3 IMPLANT
SHEARS HARMONIC ACE PLUS 36CM (ENDOMECHANICALS) ×3 IMPLANT
SLEEVE ENDOPATH XCEL 5M (ENDOMECHANICALS) ×3 IMPLANT
SPECIMEN JAR SMALL (MISCELLANEOUS) ×3 IMPLANT
STRIP CLOSURE SKIN 1/2X4 (GAUZE/BANDAGES/DRESSINGS) ×2 IMPLANT
SUT MNCRL AB 4-0 PS2 18 (SUTURE) ×3 IMPLANT
SUT VICRYL 0 UR6 27IN ABS (SUTURE) ×3 IMPLANT
TOWEL OR 17X24 6PK STRL BLUE (TOWEL DISPOSABLE) ×3 IMPLANT
TOWEL OR 17X26 10 PK STRL BLUE (TOWEL DISPOSABLE) ×3 IMPLANT
TRAY FOLEY CATH SILVER 16FR (SET/KITS/TRAYS/PACK) ×3 IMPLANT
TRAY LAPAROSCOPIC MC (CUSTOM PROCEDURE TRAY) ×3 IMPLANT
TROCAR XCEL BLUNT TIP 100MML (ENDOMECHANICALS) ×3 IMPLANT
TROCAR XCEL NON-BLD 5MMX100MML (ENDOMECHANICALS) ×3 IMPLANT
TUBING INSUFFLATION (TUBING) ×3 IMPLANT
WATER STERILE IRR 1000ML POUR (IV SOLUTION) ×3 IMPLANT

## 2017-11-14 NOTE — Op Note (Signed)
Preoperative diagnosis: Acute appendicitis Postoperative diagnosis: Gangrenous perforated appendicitis Procedure: Laparoscopic appendectomy Surgeon: Dr. Harden MoMatt Mattheus Herrera Anesthesia: General Estimated blood loss: Minimal Complications: None Drains: None Specimens: Appendix to pathology Sponge needle count was correct at completion Disposition to recovery in stable condition  Indications: This is a 26 yom with rlq pain, elevated wbc and a ct scan with acute appendicitis.  I discussed with he and his mother going to the operating room for appendectomy.   Procedure: After informed consent was obtained the patient was taken to the operating room.  He was already on antibiotics regimen.  SCDs were in place.   He was then placed under general anesthesia without complication. A Foley catheter was placed without difficulty.  He was then prepped and draped in the standard sterile surgical fashion.  A surgical timeout was then performed.  I infiltrated Marcaine below his umbilicus.  I made a vertical incision.  I identified the fascia and incised this sharply and used a Kelly clamp to enter the peritoneal cavity bluntly.  I then placed a 0 Vicryl pursestring suture through the fascia.  A Hassan trocar was placed and the abdomen was then insufflated to 15 mmHg pressure.  I placed 2 further 5 mm trocars in the suprapubic region and left lower quadrant under direct vision without complication.  I then removed his omentum from his right lower quadrant. The appendix was stuck to the retroperitoneum. I removed this and then noted that his appendix had perforated in the proximal third.  His appendix was gangrenous.  I was able to identify the appendiceal mesentery and I divided this with the Harmonic scalpel.  I clearly visualized the terminal ileum as well as the base of the cecum.  Once I had the appendix dissected down to the base of the cecum which was viable I used a GIA stapler to divide this.  I placed the  appendix in a bag and removed it through the umbilicus.  Hemostasis was observed. I then placed the omentum in the right lower quadrant.  I removed my Hassan trocar and tied my pursestring down. I placed 2 additional 0 Vicryl sutures using the EndoClose device to completely obliterate this defect.  I then removed the remaining trocars and desufflated the abdomen.  These were closed with 4-0 Monocryl and Dermabond.  He tolerated this well and will be transferred to recovery room in stable condition.

## 2017-11-14 NOTE — Anesthesia Procedure Notes (Signed)
Procedure Name: Intubation Date/Time: 11/14/2017 1:22 PM Performed by: Neldon Newport, CRNA Pre-anesthesia Checklist: Timeout performed, Patient being monitored, Suction available, Emergency Drugs available and Patient identified Patient Re-evaluated:Patient Re-evaluated prior to induction Oxygen Delivery Method: Circle system utilized Preoxygenation: Pre-oxygenation with 100% oxygen Induction Type: IV induction and Cricoid Pressure applied Ventilation: Mask ventilation without difficulty Laryngoscope Size: Mac and 4 Grade View: Grade I Tube type: Oral Tube size: 7.5 mm Number of attempts: 1 Placement Confirmation: breath sounds checked- equal and bilateral,  ETT inserted through vocal cords under direct vision and positive ETCO2 Secured at: 23 cm Tube secured with: Tape Dental Injury: Teeth and Oropharynx as per pre-operative assessment

## 2017-11-14 NOTE — ED Notes (Signed)
Spoke with phlebotomy will obtain second set of blood cultures.

## 2017-11-14 NOTE — H&P (Signed)
Samuel Herrera is an 27 y.o. male.   Chief Complaint: rlq pain HPI: 48 yom with 48 hours of abd pain that is mostly in the rlq now. Passing flatus, no bm, not eating.  Urinating fine.  Nothing relieving the pain. Has been worsening.  No fevers.     Past Medical History:  Diagnosis Date  . Anxiety    panic attacks - overwhelming feeling of school work   . Headache    h/o migraine headaches     Past Surgical History:  Procedure Laterality Date  . ORIF HUMERUS FRACTURE Right 10/03/2015   Procedure: OPEN REDUCTION INTERNAL FIXATION (ORIF) RIGHT HUMERUS FRACTURE WITH RADIAL NERVE NEUROLYSIS, REPAIR AND RECONTRUCT AS NECESSARY;  Surgeon: Roseanne Kaufman, MD;  Location: [facility];  Service: Orthopedics;  Laterality: Right;  . WRIST SURGERY     finger - grafting     No family history on file. Social History:  reports that he has never smoked. He does not have any smokeless tobacco history on file. He reports that he drinks alcohol. He reports that he has current or past drug history. Drug: Marijuana.  Allergies:  Allergies  Allergen Reactions  . Amoxicillin Hives  . Penicillins Hives    Meds none  Results for orders placed or performed during the hospital encounter of 11/14/17 (from the past 48 hour(s))  Comprehensive metabolic panel     Status: Abnormal   Collection Time: 11/14/17  9:03 AM  Result Value Ref Range   Sodium 135 135 - 145 mmol/L   Potassium 3.8 3.5 - 5.1 mmol/L   Chloride 105 101 - 111 mmol/L   CO2 21 (L) 22 - 32 mmol/L   Glucose, Bld 129 (H) 65 - 99 mg/dL   BUN 14 6 - 20 mg/dL   Creatinine, Ser 1.00 0.61 - 1.24 mg/dL   Calcium 9.6 8.9 - 10.3 mg/dL   Total Protein 7.2 6.5 - 8.1 g/dL   Albumin 4.0 3.5 - 5.0 g/dL   AST 19 15 - 41 U/L   ALT 13 (L) 17 - 63 U/L   Alkaline Phosphatase 52 38 - 126 U/L   Total Bilirubin 0.8 0.3 - 1.2 mg/dL   GFR calc non Af Amer >60 >60 mL/min   GFR calc Af Amer >60 >60 mL/min    Comment: (NOTE) The eGFR has been calculated using the  CKD EPI equation. This calculation has not been validated in all clinical situations. eGFR's persistently <60 mL/min signify possible Chronic Kidney Disease.    Anion gap 9 5 - 15    Comment: Performed at [facility] [address] [place], [place] [address]  CBC with Differential     Status: Abnormal   Collection Time: 11/14/17  9:03 AM  Result Value Ref Range   WBC 15.5 (H) 4.0 - 10.5 K/uL   RBC 4.75 4.22 - 5.81 MIL/uL   Hemoglobin 14.8 13.0 - 17.0 g/dL   HCT 41.8 39.0 - 52.0 %   MCV 88.0 78.0 - 100.0 fL   MCH 31.2 26.0 - 34.0 pg   MCHC 35.4 30.0 - 36.0 g/dL   RDW 12.2 11.5 - 15.5 %   Platelets 325 150 - 400 K/uL   Neutrophils Relative % 86 %   Neutro Abs 13.3 (H) 1.7 - 7.7 K/uL   Lymphocytes Relative 7 %   Lymphs Abs 1.1 0.7 - 4.0 K/uL   Monocytes Relative 7 %   Monocytes Absolute 1.1 (H) 0.1 - 1.0 K/uL  Eosinophils Relative 0 %   Eosinophils Absolute 0.0 0.0 - 0.7 K/uL   Basophils Relative 0 %   Basophils Absolute 0.0 0.0 - 0.1 K/uL    Comment: Performed at [facility] [address] [place], [place] [address]  Lipase, blood     Status: None   Collection Time: 11/14/17  9:03 AM  Result Value Ref Range   Lipase 25 11 - 51 U/L    Comment: Performed at [facility] [address], [place], [place] [address]  I-Stat CG4 Lactic Acid, ED     Status: Abnormal   Collection Time: 11/14/17  9:09 AM  Result Value Ref Range   Lactic Acid, Venous 2.07 (HH) 0.5 - 1.9 mmol/L   Comment NOTIFIED PHYSICIAN    Ct Abdomen Pelvis W Contrast  Result Date: 11/14/2017 CLINICAL DATA:  Right-sided abdominal pain for 2 days EXAM: CT ABDOMEN AND PELVIS WITH CONTRAST TECHNIQUE: Multidetector CT imaging of the abdomen and pelvis was performed using the standard protocol following bolus administration of intravenous contrast. CONTRAST:  156m ISOVUE-300 IOPAMIDOL (ISOVUE-300) INJECTION 61% COMPARISON:  None. FINDINGS: Lower chest: No acute abnormality. Hepatobiliary: No focal  liver abnormality is seen. No gallstones, gallbladder wall thickening, or biliary dilatation. Pancreas: Unremarkable. No pancreatic ductal dilatation or surrounding inflammatory changes. Spleen: Normal in size without focal abnormality. Adrenals/Urinary Tract: Adrenal glands are within normal limits. Kidneys demonstrate no renal calculi or obstructive changes. The bladder is partially distended. Stomach/Bowel: The large and small bowel are well visualized and within normal limits. There are changes consistent with acute appendicitis. Appendix: Location: Directly below the cecum in the right lower quadrant Diameter: 16 mm Appendicolith: Present centrally at the junction of the appendix with the cecum. There appear to be 3 small appendicoliths. Mucosal hyper-enhancement: Present Extraluminal gas: Absent Periappendiceal collection: A definitive periappendiceal fluid collection is not seen although inflammatory changes are identified extending both inferiorly and superiorly from the appendix. Vascular/Lymphatic: Aorta and its branches are within normal limits. Scattered small lymph nodes are noted likely reactive in nature throughout the retroperitoneum and right lower quadrant. Reproductive: Prostate is unremarkable. Other: Phlegm a tori changes are noted in the right lower quadrant related to the appendicitis. No focal discrete fluid collection is noted. No hernia is seen. Musculoskeletal: No bony abnormality is noted. IMPRESSION: Findings consistent with acute appendicitis with 3 central appendicoliths identified. Periappendiceal inflammatory changes are noted although no discrete collection is noted. No evidence of perforation is seen. Critical Value/emergent results were called by telephone at the time of interpretation on 11/14/2017 at 10:12 am to Dr. CMarda Stalker, who verbally acknowledged these results. Electronically Signed   By: MInez CatalinaM.D.   On: 11/14/2017 10:12    Review of Systems   Gastrointestinal: Positive for abdominal pain.  All other systems reviewed and are negative.   Blood pressure 112/61, pulse (!) 104, temperature 99.3 F (37.4 C), temperature source Oral, resp. rate 16, SpO2 100 %. Physical Exam  Constitutional: He is oriented to person, place, and time. He appears well-developed and well-nourished.  HENT:  Head: Normocephalic and atraumatic.  Eyes: No scleral icterus.  Neck: Neck supple.  Cardiovascular: Normal rate and regular rhythm.  Respiratory: Effort normal and breath sounds normal.  GI: Soft. He exhibits no distension. There is tenderness in the right lower quadrant. A hernia is present.  Neurological: He is alert and oriented to person, place, and time.  Skin: Skin is warm and dry.  Psychiatric: His behavior  is normal.     Assessment/Plan Appendicitis  Abx, recommended lap appy. Discussed procedure and risks. Discussed course for nonperforated and perforated appendicitis.  His mom was present. Will proceed today  Rolm Bookbinder, MD 11/14/2017, 11:24 AM

## 2017-11-14 NOTE — Discharge Instructions (Signed)

## 2017-11-14 NOTE — ED Triage Notes (Signed)
Pt reports RLQ abdominal pain for 2 days. Pt states that [facility] and was told to come here for eval of possible appendicitis. Pt reports nausea with no vomiting.

## 2017-11-14 NOTE — Transfer of Care (Signed)
Immediate Anesthesia Transfer of Care Note  Patient: Samuel Herrera  Procedure(s) Performed: APPENDECTOMY LAPAROSCOPIC (N/A Abdomen)  Patient Location: PACU  Anesthesia Type:General  Level of Consciousness: awake, alert  and oriented  Airway & Oxygen Therapy: Patient Spontanous Breathing and Patient connected to nasal cannula oxygen  Post-op Assessment: Report given to RN, Post -op Vital signs reviewed and stable and Patient moving all extremities X 4  Post vital signs: Reviewed and stable  Last Vitals:  Vitals Value Taken Time  BP 146/88 11/14/2017  2:41 PM  Temp 36.6 C 11/14/2017  2:42 PM  Pulse 106 11/14/2017  2:41 PM  Resp 22 11/14/2017  2:41 PM  SpO2 98 % 11/14/2017  2:41 PM  Vitals shown include unvalidated device data.  Last Pain:  Vitals:   11/14/17 1200  TempSrc:   PainSc: 8          Complications: No apparent anesthesia complications

## 2017-11-14 NOTE — Anesthesia Preprocedure Evaluation (Addendum)
Anesthesia Evaluation  Patient identified by MRN, date of birth, ID band Patient awake    Reviewed: Allergy & Precautions, NPO status , Patient's Chart, lab work & pertinent test results  Airway Mallampati: I  TM Distance: >3 FB Neck ROM: full    Dental no notable dental hx. (+) Teeth Intact, Dental Advidsory Given   Pulmonary neg pulmonary ROS,    Pulmonary exam normal        Cardiovascular Exercise Tolerance: Good negative cardio ROS   Rhythm:regular Rate:Normal     Neuro/Psych  Headaches, Anxiety negative psych ROS   GI/Hepatic negative GI ROS, Neg liver ROS,   Endo/Other  negative endocrine ROS  Renal/GU negative Renal ROS  negative genitourinary   Musculoskeletal negative musculoskeletal ROS (+)   Abdominal   Peds negative pediatric ROS (+)  Hematology negative hematology ROS (+)   Anesthesia Other Findings   Reproductive/Obstetrics negative OB ROS                           Anesthesia Physical Anesthesia Plan  ASA: I  Anesthesia Plan: General   Post-op Pain Management:    Induction:   PONV Risk Score and Plan: 1 and Treatment may vary due to age or medical condition, Ondansetron and Dexamethasone  Airway Management Planned: Oral ETT  Additional Equipment:   Intra-op Plan:   Post-operative Plan: Extubation in OR  Informed Consent: I have reviewed the patients History and Physical, chart, labs and discussed the procedure including the risks, benefits and alternatives for the proposed anesthesia with the patient or authorized representative who has indicated his/her understanding and acceptance.   Dental advisory given and Dental Advisory Given  Plan Discussed with: CRNA and Anesthesiologist  Anesthesia Plan Comments:        Anesthesia Quick Evaluation

## 2017-11-14 NOTE — ED Provider Notes (Signed)
[facility] Provider Note   CSN: [id number] Arrival date & time: 11/14/17  0841     History   Chief Complaint Chief Complaint  Patient presents with  . Abdominal Pain    HPI Samuel Herrera is a 27 y.o. male.  The history is provided by the patient, medical records, a parent and a significant other.  Abdominal Pain   This is a new problem. The current episode started 2 days ago. The problem occurs constantly. The problem has been gradually worsening. The pain is associated with an unknown factor. The pain is located in the RLQ. The quality of the pain is aching, sharp and cramping. The pain is severe. Associated symptoms include anorexia, fever (subjective), diarrhea and nausea. Pertinent negatives include melena, vomiting, constipation, dysuria, frequency, hematuria and headaches. The symptoms are aggravated by palpation. Nothing relieves the symptoms.    Past Medical History:  Diagnosis Date  . Anxiety    panic attacks - overwhelming feeling of school work   . Headache    h/o migraine headaches     Patient Active Problem List   Diagnosis Date Noted  . Fracture closed, humerus, shaft 10/03/2015    Past Surgical History:  Procedure Laterality Date  . ORIF HUMERUS FRACTURE Right 10/03/2015   Procedure: OPEN REDUCTION INTERNAL FIXATION (ORIF) RIGHT HUMERUS FRACTURE WITH RADIAL NERVE NEUROLYSIS, REPAIR AND RECONTRUCT AS NECESSARY;  Surgeon: Dominica SeverinWilliam Gramig, MD;  Location: [facility] OR;  Service: Orthopedics;  Laterality: Right;  . WRIST SURGERY     finger - grafting         Home Medications    Prior to Admission medications   Medication Sig Start Date End Date Taking? Authorizing Provider  amphetamine-dextroamphetamine (ADDERALL XR) 20 MG 24 hr capsule Take 20 mg by mouth daily. 08/24/15 09/22/15  [provider]  methocarbamol (ROBAXIN) 500 MG tablet Take 1 tablet (500 mg total) by mouth every 6 (six) hours as needed for muscle  spasms. Patient not taking: Reported on 11/14/2017 10/05/15   Karie ChimeraBuchanan, Brian, PA-C  oxyCODONE (OXY IR/ROXICODONE) 5 MG immediate release tablet Take 1-2 tablets (5-10 mg total) by mouth every 3 (three) hours as needed for moderate pain. Patient not taking: Reported on 11/14/2017 10/05/15   Karie ChimeraBuchanan, Brian, PA-C  pseudoephedrine (SUDAFED) 30 MG tablet Take 30 mg by mouth every 4 (four) hours as needed for congestion.    [provider]    Family History No family history on file.  Social History Social History   Tobacco Use  . Smoking status: Never Smoker  Substance Use Topics  . Alcohol use: Yes    Comment: 2 beers q day- none since 09/13/2015  . 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 Negative for diaphoresis.  HENT: Negative for congestion.   Respiratory: Negative for cough, chest tightness, shortness of breath and wheezing.   Cardiovascular: Negative for chest pain, palpitations and leg swelling.  Gastrointestinal: Positive for abdominal pain, anorexia, diarrhea and nausea. Negative for abdominal distention, anal bleeding, blood in stool, constipation, melena and vomiting.  Genitourinary: Negative for dysuria, flank pain, frequency and hematuria.  Musculoskeletal: Negative for back pain and neck pain.  Skin: Negative for rash and wound.  Neurological: Negative for light-headedness and headaches.  Psychiatric/Behavioral: Negative for agitation.  All other systems reviewed and are negative.    Physical Exam Updated Vital Signs BP 120/60 (BP Location: Right Arm)   Pulse 95   Temp 99.3 F (37.4 C) (Oral)   Resp (!) 22   SpO2 100%   Physical Exam  Constitutional: He is oriented to person, place, and time. He appears well-developed and well-nourished.  Non-toxic appearance. He does not  appear ill. No distress.  HENT:  Head: Normocephalic.  Nose: Nose normal.  Mouth/Throat: Oropharynx is clear and moist. No oropharyngeal exudate.  Eyes: Pupils are equal, round, and reactive to light. Conjunctivae and EOM are normal.  Neck: Normal range of motion.  Cardiovascular: Normal rate and intact distal pulses.  No murmur heard. Pulmonary/Chest: Effort normal. No stridor. No respiratory distress. He has no wheezes. He has no rales. He exhibits no tenderness.  Abdominal: Normal appearance and bowel sounds are normal. He exhibits no distension and no mass. There is tenderness in the right lower quadrant. There is rebound. There is no rigidity, no guarding and no CVA tenderness. Hernia confirmed negative in the right inguinal area and confirmed negative in the left inguinal area.    Genitourinary: Testes normal and penis normal. Right testis shows no swelling and no tenderness. Left testis shows no swelling and no tenderness. Circumcised.  Musculoskeletal: He exhibits no tenderness.  Lymphadenopathy: No inguinal adenopathy noted on the right or left side.  Neurological: He is alert and oriented to person, place, and time.  Skin: Capillary refill takes less than 2 seconds. He is not diaphoretic. No erythema. No pallor.  Nursing note and vitals reviewed.    ED Treatments / Results  Labs (all labs ordered are listed, but only abnormal results are displayed) Labs Reviewed  COMPREHENSIVE METABOLIC PANEL - Abnormal; Notable for the following components:      Result Value   CO2 21 (*)    Glucose, Bld 129 (*)    ALT 13 (*)    All other components within normal limits  CBC WITH DIFFERENTIAL/PLATELET - Abnormal; Notable for the following components:   WBC 15.5 (*)    Neutro Abs 13.3 (*)    Monocytes Absolute 1.1 (*)    All other components within normal limits  I-STAT CG4 LACTIC ACID, ED - Abnormal; Notable for the following components:   Lactic Acid, Venous 2.07 (*)    All other  components within normal limits  URINE CULTURE  CULTURE, BLOOD (ROUTINE X 2)  CULTURE, BLOOD (ROUTINE X 2)  LIPASE, BLOOD  URINALYSIS, ROUTINE W REFLEX MICROSCOPIC  I-STAT CG4 LACTIC ACID, ED    EKG None  Radiology Ct Abdomen Pelvis W Contrast  Result Date: 11/14/2017 CLINICAL DATA:  Right-sided abdominal pain for 2 days EXAM: CT ABDOMEN AND PELVIS WITH CONTRAST TECHNIQUE: Multidetector CT imaging of the abdomen and pelvis was performed using the standard protocol following bolus administration of intravenous contrast. CONTRAST:  ISOVUE-300 IOPAMIDOL (ISOVUE-300) INJECTION 61% COMPARISON:  None. FINDINGS: Lower chest: No acute abnormality. Hepatobiliary: No focal liver abnormality is seen. No gallstones, gallbladder wall thickening, or biliary dilatation. Pancreas: Unremarkable. No pancreatic ductal dilatation or surrounding inflammatory changes. Spleen: Normal in size without focal abnormality. Adrenals/Urinary Tract: Adrenal glands are within normal limits. Kidneys demonstrate no renal calculi or obstructive changes. The bladder is partially distended. Stomach/Bowel: The large and small bowel are well visualized and within normal limits. There are changes consistent with acute appendicitis. Appendix: Location: Directly below the cecum in the right lower quadrant Diameter: 16 mm Appendicolith:  Present centrally at the junction of the appendix with the cecum. There appear to be 3 small appendicoliths. Mucosal hyper-enhancement: Present Extraluminal gas: Absent Periappendiceal collection: A definitive periappendiceal fluid collection is not seen although inflammatory changes are identified extending both inferiorly and superiorly from the appendix. Vascular/Lymphatic: Aorta and its branches are within normal limits. Scattered small lymph nodes are noted likely reactive in nature throughout the retroperitoneum and right lower quadrant. Reproductive: Prostate is unremarkable. Other: Phlegm a tori  changes are noted in the right lower quadrant related to the appendicitis. No focal discrete fluid collection is noted. No hernia is seen. Musculoskeletal: No bony abnormality is noted. IMPRESSION: Findings consistent with acute appendicitis with 3 central appendicoliths identified. Periappendiceal inflammatory changes are noted although no discrete collection is noted. No evidence of perforation is seen. Critical Value/emergent results were called by telephone at the time of interpretation on 11/14/2017 at 10:12 am to Dr. Lynden Oxford , who verbally acknowledged these results. Electronically Signed   By: Alcide Clever M.D.   On: 11/14/2017 10:12    Procedures Procedures (including critical care time)  Medications Ordered in ED Medications  iopamidol (ISOVUE-300) 61 % injection (has no administration in time range)  enoxaparin (LOVENOX) injection 40 mg (has no administration in time range)  ondansetron (ZOFRAN-ODT) disintegrating tablet 4 mg (has no administration in time range)    Or  ondansetron (ZOFRAN) injection 4 mg (has no administration in time range)  morphine 4 MG/ML injection 2 mg (has no administration in time range)  0.9 %  sodium chloride infusion ( Intravenous New Bag/Given 11/14/17 1716)  ciprofloxacin (CIPRO) IVPB 400 mg (400 mg Intravenous New Bag/Given 11/14/17 1749)    And  metroNIDAZOLE (FLAGYL) IVPB 500 mg (has no administration in time range)  methocarbamol (ROBAXIN) tablet 500 mg (has no administration in time range)  oxyCODONE (Oxy IR/ROXICODONE) immediate release tablet 5-10 mg (has no administration in time range)  ketorolac (TORADOL) 15 MG/ML injection 15 mg (has no administration in time range)  simethicone (MYLICON) chewable tablet 40 mg (has no administration in time range)  morphine 4 MG/ML injection 4 mg (4 mg Intravenous Given 11/14/17 0938)  sodium chloride 0.9 % bolus 1,000 mL (0 mLs Intravenous Stopped 11/14/17 1030)  ondansetron (ZOFRAN) injection 4 mg (4 mg  Intravenous Given 11/14/17 0936)  iopamidol (ISOVUE-300) 61 % injection 100 mL (100 mLs Intravenous Contrast Given 11/14/17 0941)  morphine 4 MG/ML injection 4 mg (4 mg Intravenous Given 11/14/17 1205)  ketorolac (TORADOL) 30 MG/ML injection (15 mg  Given 11/14/17 1708)     Initial Impression / Assessment and Plan / ED Course  I have reviewed the triage vital signs and the nursing notes.  Pertinent labs & imaging results that were available during my care of the patient were reviewed by me and considered in my medical decision making (see chart for details).     Samuel Herrera is a 27 y.o. male with no significant past medical history who was sent from an urgent care for evaluation of right lower quadrant abdominal pain, nausea, and chills.  Patient reports that for the last 2 days he has had right lower quadrant pain is been worsening.  He describes it as a sharp and aching pain.  He is never had pain like this before and has never had appendicitis.  He reports a family history of kidney stones but no personal history.  He reports nausea but no vomiting.  He does report that he has had several  bowel movements that have been very small and somewhat loose but nonbloody.  He denies any testicle pain or groin pain at this time.  He denies any abdominal trauma.  He denies new medications.  He denies any rhinorrhea, cough, or congestion.  No chest pain.  He reports that the urgent care told him they were concerned about appendicitis and sent him here for evaluation.  Patient reports no appetite.  my exam, patient had right lower quadrant abdominal pain.  Patient did have a positive heel tap causing the right upper quadrant to her.  Patient also had pain with releasing of the pressure concerning for rebound tenderness.  No guarding.  GU exam unremarkable with no testicular pain and no hernias palpated. Bowel sounds were present.  Lungs clear and chest nontender.   Patient will be made n.p.o., be given fluids,  pain medicine, nausea medicine.  Patient will have CT scan to further evaluate.   Initial lactic acid slightly elevated at 2.07.  Fluids continued.  Leukocytosis of 15.5 also present.  Clinically, I am concerned about intra-abdominal pathology such as appendicitis for the patient.  Given the reported loose stool, a Diverticulitis or infectious diarrhea with inflammation also possible.    Anticipate reassessment after imaging and other lab tests.    CT scan revealed appendicitis with no perforation.  General surgery called.  Anticipate admission versus OR for the patient.  Patient's pain was much improved after pain medication.  Patient remained n.p.o. and get fluids.    Final Clinical Impressions(s) / ED Diagnoses   Final diagnoses:  Right lower quadrant abdominal pain  Acute appendicitis, unspecified acute appendicitis type    Clinical Impression: 1. Right lower quadrant abdominal pain   2. Acute appendicitis, unspecified acute appendicitis type     Disposition: Admit  This note was prepared with assistance of Dragon voice recognition software. Occasional wrong-word or sound-a-like substitutions may have occurred due to the inherent limitations of voice recognition software.      Tegeler, Canary Brim, MD 11/14/17 [phone]

## 2017-11-14 NOTE — ED Notes (Signed)
Doctor at bedside.

## 2017-11-14 NOTE — ED Notes (Signed)
Notified phlebotomy of blood cultures.

## 2017-11-14 NOTE — Progress Notes (Signed)
S: Leilani AbleDustin H Losey is a 27 y.o. male who presents with 3 day history of N/V/ and abdominal pain. Pain is described as a sharp, stabbing feeling, radiating to the groin. Pain is no associated with food, Last BM 3 days ago. States he feels bloated and unable to use the bathroom. He reports increased fiber intake over previous 3 days without relief. HE has both appendix and gallbladder. Pain is worsened with movement and relieved at rest.  Review of Systems  Constitutional: Positive for chills, fever and malaise/fatigue.  Gastrointestinal: Positive for abdominal pain, constipation, nausea and vomiting. Negative for diarrhea.  Genitourinary: Negative.   Neurological: Negative for dizziness.    O: Vitals:   11/14/17 0814  BP: 124/62  Pulse: (!) 104  Resp: (!) 22  Temp: 99.4 F (37.4 C)  SpO2: 100%   Physical Exam  Constitutional: He appears well-developed and well-nourished. He appears ill.  Appears in pain  HENT:  Head: Normocephalic.  Eyes: Conjunctivae are normal.  Cardiovascular: Regular rhythm. Tachycardia present.  Pulmonary/Chest: Effort normal.  Abdominal: Normal appearance. Bowel sounds are increased. There is tenderness in the right lower quadrant. There is rebound. There is negative Murphy's sign.  +psoas  Neurological: He is alert.  Skin: Skin is warm. Capillary refill takes less than 2 seconds.  Nursing note and vitals reviewed.   A: 1. Right lower quadrant abdominal pain     P:  RLQ abdominal pain with rebound tenderness, +psoas sign. [facility]. Counseled remain NPO till cleared by ED provider

## 2017-11-15 DIAGNOSIS — R300 Dysuria: Secondary | ICD-10-CM | POA: Diagnosis present

## 2017-11-15 DIAGNOSIS — R000 Tachycardia, unspecified: Secondary | ICD-10-CM | POA: Diagnosis present

## 2017-11-15 DIAGNOSIS — R1031 Right lower quadrant pain: Secondary | ICD-10-CM | POA: Diagnosis present

## 2017-11-15 DIAGNOSIS — K567 Ileus, unspecified: Secondary | ICD-10-CM | POA: Diagnosis present

## 2017-11-15 DIAGNOSIS — K3532 Acute appendicitis with perforation and localized peritonitis, without abscess: Secondary | ICD-10-CM | POA: Diagnosis present

## 2017-11-15 DIAGNOSIS — Z880 Allergy status to penicillin: Secondary | ICD-10-CM | POA: Diagnosis not present

## 2017-11-15 LAB — CBC
HCT: 36.8 % — ABNORMAL LOW (ref 39.0–52.0)
Hemoglobin: 12.3 g/dL — ABNORMAL LOW (ref 13.0–17.0)
MCH: 30.4 pg (ref 26.0–34.0)
MCHC: 33.4 g/dL (ref 30.0–36.0)
MCV: 91.1 fL (ref 78.0–100.0)
PLATELETS: 264 10*3/uL (ref 150–400)
RBC: 4.04 MIL/uL — ABNORMAL LOW (ref 4.22–5.81)
RDW: 12.5 % (ref 11.5–15.5)
WBC: 12.5 10*3/uL — ABNORMAL HIGH (ref 4.0–10.5)

## 2017-11-15 NOTE — Plan of Care (Signed)
Pt using Incentive Spirometer and reaching 750 with a goal of 1000 to 1250.

## 2017-11-15 NOTE — Progress Notes (Signed)
1 Day Post-Op  Subjective: Stable and alert.  Heart rate 78.  BP 117/62.  SPO2 99% room air denies nausea or vomiting.  Voiding well.  Has been out of bed once. Able to void Hemoglobin 12.3.  WBC 12,500, better.   Objective: Vital signs in last 24 hours: Temp:  [97.8 F (36.6 C)-99.1 F (37.3 C)] 98.5 F (36.9 C) (04/06 0517) Pulse Rate:  [78-115] 78 (04/06 0517) Resp:  [6-28] 18 (04/06 0517) BP: (102-147)/(53-88) 117/62 (04/06 0517) SpO2:  [91 %-100 %] 99 % (04/06 0517) Weight:  [88.7 kg (195 lb 9.6 oz)] 88.7 kg (195 lb 9.6 oz) (04/05 1308) Last BM Date: 11/13/17  Intake/Output from previous day: 04/05 0701 - 04/06 0700 In: 2613 [P.O.:220; I.V.:1193; IV Piggyback:1200] Out: 1160 [Urine:1150; Blood:10] Intake/Output this shift: No intake/output data recorded.  General appearance: Alert.  Cooperative.  Mental status normal.  Minimal distress Resp: clear to auscultation bilaterally GI: Soft.  Not really distended.  Occasional bowel sounds.  Wound is clean. Extremities: extremities normal, atraumatic, no cyanosis or edema  Lab Results:  Results for orders placed or performed during the hospital encounter of 11/14/17 (from the past 24 hour(s))  I-Stat CG4 Lactic Acid, ED     Status: None   Collection Time: 11/14/17 11:39 AM  Result Value Ref Range   Lactic Acid, Venous 1.39 0.5 - 1.9 mmol/L  CBC     Status: Abnormal   Collection Time: 11/15/17  5:45 AM  Result Value Ref Range   WBC 12.5 (H) 4.0 - 10.5 K/uL   RBC 4.04 (L) 4.22 - 5.81 MIL/uL   Hemoglobin 12.3 (L) 13.0 - 17.0 g/dL   HCT 16.136.8 (L) 09.639.0 - 04.552.0 %   MCV 91.1 78.0 - 100.0 fL   MCH 30.4 26.0 - 34.0 pg   MCHC 33.4 30.0 - 36.0 g/dL   RDW 40.912.5 81.111.5 - 91.415.5 %   Platelets 264 150 - 400 K/uL     Studies/Results: Ct Abdomen Pelvis W Contrast  Result Date: 11/14/2017 CLINICAL DATA:  Right-sided abdominal pain for 2 days EXAM: CT ABDOMEN AND PELVIS WITH CONTRAST TECHNIQUE: Multidetector CT imaging of the abdomen and  pelvis was performed using the standard protocol following bolus administration of intravenous contrast. CONTRAST:  100mL ISOVUE-300 IOPAMIDOL (ISOVUE-300) INJECTION 61% COMPARISON:  None. FINDINGS: Lower chest: No acute abnormality. Hepatobiliary: No focal liver abnormality is seen. No gallstones, gallbladder wall thickening, or biliary dilatation. Pancreas: Unremarkable. No pancreatic ductal dilatation or surrounding inflammatory changes. Spleen: Normal in size without focal abnormality. Adrenals/Urinary Tract: Adrenal glands are within normal limits. Kidneys demonstrate no renal calculi or obstructive changes. The bladder is partially distended. Stomach/Bowel: The large and small bowel are well visualized and within normal limits. There are changes consistent with acute appendicitis. Appendix: Location: Directly below the cecum in the right lower quadrant Diameter: 16 mm Appendicolith: Present centrally at the junction of the appendix with the cecum. There appear to be 3 small appendicoliths. Mucosal hyper-enhancement: Present Extraluminal gas: Absent Periappendiceal collection: A definitive periappendiceal fluid collection is not seen although inflammatory changes are identified extending both inferiorly and superiorly from the appendix. Vascular/Lymphatic: Aorta and its branches are within normal limits. Scattered small lymph nodes are noted likely reactive in nature throughout the retroperitoneum and right lower quadrant. Reproductive: Prostate is unremarkable. Other: Phlegm a tori changes are noted in the right lower quadrant related to the appendicitis. No focal discrete fluid collection is noted. No hernia is seen. Musculoskeletal: No bony abnormality is noted.  IMPRESSION: Findings consistent with acute appendicitis with 3 central appendicoliths identified. Periappendiceal inflammatory changes are noted although no discrete collection is noted. No evidence of perforation is seen. Critical Value/emergent  results were called by telephone at the time of interpretation on 11/14/2017 at 10:12 am to Dr. Lynden Oxford , who verbally acknowledged these results. Electronically Signed   By: Alcide Clever M.D.   On: 11/14/2017 10:12    . enoxaparin (LOVENOX) injection  40 mg Subcutaneous Q24H     Assessment/Plan: s/p Procedure(s): APPENDECTOMY LAPAROSCOPIC  POD #1.  Laparoscopic appendectomy for gangrenous appendicitis with perforation. Continue IV antibiotics Advance diet slowly Mobilize ambulate in hall    @PROBHOSP @  LOS: 1 day    Ernestene Mention 11/15/2017  . .prob

## 2017-11-16 MED ORDER — PROMETHAZINE HCL 25 MG/ML IJ SOLN
12.5000 mg | Freq: Three times a day (TID) | INTRAMUSCULAR | Status: DC | PRN
Start: 2017-11-16 — End: 2017-11-19
  Administered 2017-11-16 – 2017-11-17 (×4): 12.5 mg via INTRAVENOUS
  Filled 2017-11-16 (×4): qty 1

## 2017-11-16 NOTE — Progress Notes (Signed)
Advanced diet to soft bland so pt would have more choices and less dairy on the full liquids.  Told pt and mom to go slow and pick soft, bland foods to try. Encouraged pt to walk in halls and use IS, gas chair.

## 2017-11-16 NOTE — Progress Notes (Signed)
2 Days Post-Op  Subjective: Alert and stable.  Appetite a little better.  Some nausea but no vomiting.  Ambulating in halls.  Voiding without difficulty. Remains on Cipro and Flagyl IV for perforated appendicitis with peritonitis  Objective: Vital signs in last 24 hours: Temp:  [97.6 F (36.4 C)-100.1 F (37.8 C)] 100.1 F (37.8 C) (04/07 0544) Pulse Rate:  [98-104] 104 (04/07 0544) Resp:  [17-18] 17 (04/07 0544) BP: (127-132)/(68-75) 132/70 (04/07 0544) SpO2:  [95 %-98 %] 95 % (04/07 0544) Last BM Date: 11/13/17  Intake/Output from previous day: 04/06 0701 - 04/07 0700 In: 4360 [P.O.:2010; I.V.:1950; IV Piggyback:400] Out: 675 [Urine:675] Intake/Output this shift: Total I/O In: 120 [P.O.:120] Out: -   General appearance: Alert.  Cooperative.  A little anxious and withdrawn.  Family present Resp: clear to auscultation bilaterally GI: Soft.  Mild diffuse tenderness.  Wounds look fine.  Not distended.  Lab Results:  No results found for this or any previous visit (from the past 24 hour(s)).   Studies/Results: No results found.  . enoxaparin (LOVENOX) injection  40 mg Subcutaneous Q24H     Assessment/Plan: s/p Procedure(s): APPENDECTOMY LAPAROSCOPIC  POD #2.  Laparoscopic appendectomy for gangrenous appendicitis with perforation. Continue IV antibiotics Advance diet slowly Mobilize ambulate in hall Home when pain and nausea improve and GI function normalizes will need 7 days of oral antibiotics at home     @PROBHOSP @  LOS: 2 days    Samuel Herrera 11/16/2017  . .prob

## 2017-11-17 ENCOUNTER — Encounter (HOSPITAL_COMMUNITY): Payer: Self-pay | Admitting: General Surgery

## 2017-11-17 LAB — CBC
HCT: 35.3 % — ABNORMAL LOW (ref 39.0–52.0)
Hemoglobin: 11.8 g/dL — ABNORMAL LOW (ref 13.0–17.0)
MCH: 30.2 pg (ref 26.0–34.0)
MCHC: 33.4 g/dL (ref 30.0–36.0)
MCV: 90.3 fL (ref 78.0–100.0)
PLATELETS: 284 10*3/uL (ref 150–400)
RBC: 3.91 MIL/uL — ABNORMAL LOW (ref 4.22–5.81)
RDW: 12.2 % (ref 11.5–15.5)
WBC: 11.3 10*3/uL — ABNORMAL HIGH (ref 4.0–10.5)

## 2017-11-17 MED ORDER — DOCUSATE SODIUM 100 MG PO CAPS
100.0000 mg | ORAL_CAPSULE | Freq: Every day | ORAL | Status: DC
  Administered 2017-11-17: 100 mg via ORAL
  Filled 2017-11-17 (×2): qty 1

## 2017-11-17 MED ORDER — ACETAMINOPHEN 325 MG PO TABS
650.0000 mg | ORAL_TABLET | Freq: Four times a day (QID) | ORAL | Status: DC | PRN
  Administered 2017-11-17 – 2017-11-19 (×5): 650 mg via ORAL
  Filled 2017-11-17 (×5): qty 2

## 2017-11-17 MED ORDER — POLYETHYLENE GLYCOL 3350 17 G PO PACK
17.0000 g | PACK | Freq: Once | ORAL | Status: AC
  Administered 2017-11-17: 17 g via ORAL
  Filled 2017-11-17: qty 1

## 2017-11-17 NOTE — Progress Notes (Signed)
Patient has a temp of 100.8. MD on call paged. Awaiting any further orders.

## 2017-11-17 NOTE — Progress Notes (Signed)
[facility] Progress Note  3 Days Post-Op  Subjective: CC:  Reports last night was "rough" - nausea and low grade temps. Does not feel like his nausea or pain are worse after eating. Denies vomiting. Having flatus but no BM. Mobilizing. Reports some mild, intermittent dysuria and states urine is more concentrated today.  Objective: Vital signs in last 24 hours: Temp:  [98.6 F (37 C)-100.8 F (38.2 C)] 100.8 F (38.2 C) (04/08 0515) Pulse Rate:  [110-123] 118 (04/08 0600) Resp:  [17-19] 19 (04/08 0545) BP: (132-142)/(85-89) 132/85 (04/08 0515) SpO2:  [77 %-95 %] 95 % (04/08 0600) Last BM Date: 11/13/17  Intake/Output from previous day: 04/07 0701 - 04/08 0700 In: 1980 [P.O.:360; I.V.:1320; IV Piggyback:300] Out: 500 [Urine:500] Intake/Output this shift: No intake/output data recorded.  PE: Gen:  Alert, NAD, pleasant Pulm:  Normal effort Abd: Soft, mild distention, TTP RLQ without peritonitis, +BS, incisions c/d/i w/ steris  Skin: warm and dry, no rashes  Psych: A&Ox3   Lab Results:  Recent Labs    11/14/17 0903 11/15/17 0545  WBC 15.5* 12.5*  HGB 14.8 12.3*  HCT 41.8 36.8*  PLT 325 264   BMET Recent Labs    11/14/17 0903  NA 135  K 3.8  CL 105  CO2 21*  GLUCOSE 129*  BUN 14  CREATININE 1.00  CALCIUM 9.6   PT/INR No results for input(s): LABPROT, INR in the last 72 hours. CMP     Component Value Date/Time   NA 135 11/14/2017 0903   K 3.8 11/14/2017 0903   CL 105 11/14/2017 0903   CO2 21 (L) 11/14/2017 0903   GLUCOSE 129 (H) 11/14/2017 0903   BUN 14 11/14/2017 0903   CREATININE 1.00 11/14/2017 0903   CALCIUM 9.6 11/14/2017 0903   PROT 7.2 11/14/2017 0903   ALBUMIN 4.0 11/14/2017 0903   AST 19 11/14/2017 0903   ALT 13 (L) 11/14/2017 0903   ALKPHOS 52 11/14/2017 0903   BILITOT 0.8 11/14/2017 0903   GFRNONAA >60 11/14/2017 0903   GFRAA >60 11/14/2017 0903   Lipase     Component Value Date/Time   LIPASE 25 11/14/2017 0903        Studies/Results: No results found.  Anti-infectives: Anti-infectives (From admission, onward)   Start     Dose/Rate Route Frequency Ordered Stop   11/14/17 2300  ciprofloxacin (CIPRO) IVPB 400 mg     400 mg 200 mL/hr over 60 Minutes Intravenous Every 12 hours 11/14/17 1657     11/14/17 2000  metroNIDAZOLE (FLAGYL) IVPB 500 mg     500 mg 100 mL/hr over 60 Minutes Intravenous Every 8 hours 11/14/17 1657     11/14/17 1130  ciprofloxacin (CIPRO) IVPB 400 mg  Status:  Discontinued     400 mg 200 mL/hr over 60 Minutes Intravenous Every 12 hours 11/14/17 1128 11/14/17 1657   11/14/17 1130  metroNIDAZOLE (FLAGYL) IVPB 500 mg  Status:  Discontinued     500 mg 100 mL/hr over 60 Minutes Intravenous Every 8 hours 11/14/17 1128 11/14/17 1657     Assessment/Plan GANGRENOUS APPENDICITIS WITH PERFORATION POD#3 S/P LAPAROSCOPIC APPENDECTOMY 4/5 MW  - low grade temps 100.8, sinus tachycardia 118 BPM - check CBC  - increase IVF; pt not drinking quite as much 2/2 to nausea - do not advance diet, continue SOFT; pt educated to drink liquids if solid food increases nausea/pain. - continue PRN pain meds and antiemetics  - start stool softener and miralax  - mobilize/IS  FEN: SOFT, IVF @ 100 mL/hr VTE: SCD's, Lovenox ID: cipro/flagyl 4/4>>  Foley: none  Dispo: CBC, follow temp; may switch abx to invanz for worsening leukocytosis and persistent fever.  May need repeat CT Abd/Pelvis in 24-48h to assess for intra-abdominal abscess.   LOS: 3 days    Adam PhenixElizabeth S Simaan , [facility] 11/17/2017, 8:49 AM Pager: [phone] Consults: [phone] Mon-Fri 7:00 am-4:30 pm Sat-Sun 7:00 am-11:30 am

## 2017-11-17 NOTE — Anesthesia Postprocedure Evaluation (Signed)
Anesthesia Post Note  Patient: Samuel Herrera  Procedure(s) Performed: APPENDECTOMY LAPAROSCOPIC (N/A Abdomen)     Patient location during evaluation: PACU Anesthesia Type: General Level of consciousness: awake and alert Pain management: pain level controlled Vital Signs Assessment: post-procedure vital signs reviewed and stable Respiratory status: spontaneous breathing, nonlabored ventilation, respiratory function stable and patient connected to nasal cannula oxygen Cardiovascular status: blood pressure returned to baseline and stable Postop Assessment: no apparent nausea or vomiting Anesthetic complications: no    Last Vitals:  Vitals:   11/17/17 0545 11/17/17 0600  BP:    Pulse: (!) 116 (!) 118  Resp: 19   Temp:    SpO2: 94% 95%    Last Pain:  Vitals:   11/17/17 0600  TempSrc:   PainSc: 5                  Trevor IhaStephen A Tomiko Schoon

## 2017-11-17 NOTE — Progress Notes (Signed)
RN educated patient on the importance of using his IS to help decrease the chances of recurring high temps- patient admitted to not using the IS at all last night because he was not feeling well. Reinforced education. Will continue to monitor

## 2017-11-18 MED ORDER — METOCLOPRAMIDE HCL 5 MG/ML IJ SOLN
5.0000 mg | Freq: Three times a day (TID) | INTRAMUSCULAR | Status: AC
  Administered 2017-11-18 – 2017-11-19 (×3): 5 mg via INTRAVENOUS
  Filled 2017-11-18 (×3): qty 2

## 2017-11-18 MED ORDER — IBUPROFEN 400 MG PO TABS
400.0000 mg | ORAL_TABLET | Freq: Three times a day (TID) | ORAL | Status: DC | PRN
Start: 2017-11-18 — End: 2017-11-19

## 2017-11-18 NOTE — Care Management Note (Signed)
Case Management Note  Patient Details  Name: Leilani AbleDustin H Mcadams MRN: [id number] Date of Birth: Dec 09, 1990  Subjective/Objective:                    Action/Plan:  Perforated gangrenous appendicitis 4-5 lap appendectomy Nausea , dry heaves, clear liquid diet  Continue to follow for discharge needs. Expected Discharge Date:                  Expected Discharge Plan:  Home/Self Care  In-House Referral:     Discharge planning Services     Post Acute Care Choice:  NA Choice offered to:     DME Arranged:    DME Agency:     HH Arranged:    HH Agency:     Status of Service:  In process, will continue to follow  If discussed at Long Length of Stay Meetings, dates discussed:    Additional Comments:  Kingsley PlanWile, Minha Fulco Marie, RN 11/18/2017, 11:14 AM

## 2017-11-18 NOTE — Progress Notes (Signed)
[facility] Progress Note  4 Days Post-Op  Subjective: CC:  Denies pain but c/o significant nausea with dry heaves overnight. Reports having a lot of loose bowel movements starting at 10PM last night. Mobilizing some but laying in bed a lot.   Objective: Vital signs in last 24 hours: Temp:  [99.4 F (37.4 C)-100.6 F (38.1 C)] 99.5 F (37.5 C) (04/09 0813) Pulse Rate:  [90-113] 90 (04/09 0813) Resp:  [18-21] 18 (04/09 0813) BP: (134-138)/(77-88) 135/85 (04/09 0813) SpO2:  [82 %-96 %] 96 % (04/09 0813) Last BM Date: 11/18/17  Intake/Output from previous day: 04/08 0701 - 04/09 0700 In: 3921.7 [P.O.:330; I.V.:2791.7; IV Piggyback:800] Out: 2 [Stool:2] Intake/Output this shift: Total I/O In: 90 [P.O.:90] Out: -   PE: Gen:  Alert, NAD, pleasant Card:  Regular rate and rhythm, pedal pulses 2+ BL Pulm:  Normal effort, clear to auscultation bilaterally  Abd: Soft, non-tender, mild distention, hypoactive bowel sounds, no peritonitis.  Skin: warm and dry, no rashes  Psych: A&Ox3   Lab Results:  Recent Labs    11/17/17 0914  WBC 11.3*  HGB 11.8*  HCT 35.3*  PLT 284   BMET No results for input(s): NA, K, CL, CO2, GLUCOSE, BUN, CREATININE, CALCIUM in the last 72 hours. PT/INR No results for input(s): LABPROT, INR in the last 72 hours. CMP     Component Value Date/Time   NA 135 11/14/2017 0903   K 3.8 11/14/2017 0903   CL 105 11/14/2017 0903   CO2 21 (L) 11/14/2017 0903   GLUCOSE 129 (H) 11/14/2017 0903   BUN 14 11/14/2017 0903   CREATININE 1.00 11/14/2017 0903   CALCIUM 9.6 11/14/2017 0903   PROT 7.2 11/14/2017 0903   ALBUMIN 4.0 11/14/2017 0903   AST 19 11/14/2017 0903   ALT 13 (L) 11/14/2017 0903   ALKPHOS 52 11/14/2017 0903   BILITOT 0.8 11/14/2017 0903   GFRNONAA >60 11/14/2017 0903   GFRAA >60 11/14/2017 0903   Lipase     Component Value Date/Time   LIPASE 25 11/14/2017 0903       Studies/Results: No results  found.  Anti-infectives: Anti-infectives (From admission, onward)   Start     Dose/Rate Route Frequency Ordered Stop   11/14/17 2300  ciprofloxacin (CIPRO) IVPB 400 mg     400 mg 200 mL/hr over 60 Minutes Intravenous Every 12 hours 11/14/17 1657     11/14/17 2000  metroNIDAZOLE (FLAGYL) IVPB 500 mg     500 mg 100 mL/hr over 60 Minutes Intravenous Every 8 hours 11/14/17 1657     11/14/17 1130  ciprofloxacin (CIPRO) IVPB 400 mg  Status:  Discontinued     400 mg 200 mL/hr over 60 Minutes Intravenous Every 12 hours 11/14/17 1128 11/14/17 1657   11/14/17 1130  metroNIDAZOLE (FLAGYL) IVPB 500 mg  Status:  Discontinued     500 mg 100 mL/hr over 60 Minutes Intravenous Every 8 hours 11/14/17 1128 11/14/17 1657       Assessment/Plan GANGRENOUS APPENDICITIS WITH PERFORATION POD#4 S/P LAPAROSCOPIC APPENDECTOMY 4/5 MW  -fevers resolved, HR improving - WBC 11 from 12 - persistent nausea with dry heaves over night - also had multiple loose stools. - back of diet to clears, add reglan. Persistent nausea/vomiting may warrant NG tube. - continue PRN pain meds and antiemetics  - mobilize/IS!!   FEN: clears, IVF @ 125 mL/hr VTE: SCD's, Lovenox ID: cipro/flagyl 4/4>>  Foley: none  Dispo: post-operative ileus. Back off diet and add reglan.  LOS: 4 days    Samuel Herrera , [facility] 11/18/2017, 9:37 AM Pager: [phone] Consults: [phone] Mon-Fri 7:00 am-4:30 pm Sat-Sun 7:00 am-11:30 am

## 2017-11-19 LAB — CULTURE, BLOOD (ROUTINE X 2)
CULTURE: NO GROWTH
Culture: NO GROWTH
Special Requests: ADEQUATE
Special Requests: ADEQUATE

## 2017-11-19 LAB — C DIFFICILE QUICK SCREEN W PCR REFLEX
C DIFFICILE (CDIFF) INTERP: NOT DETECTED
C DIFFICILE (CDIFF) TOXIN: NEGATIVE
C DIFFICLE (CDIFF) ANTIGEN: NEGATIVE

## 2017-11-19 MED ORDER — IBUPROFEN 400 MG PO TABS: 400.0000 mg | ORAL_TABLET | Freq: Three times a day (TID) | ORAL | 0 refills | Status: DC | PRN

## 2017-11-19 MED ORDER — ACETAMINOPHEN 325 MG PO TABS: 650.0000 mg | ORAL_TABLET | Freq: Four times a day (QID) | ORAL | Status: DC | PRN

## 2017-11-19 NOTE — Progress Notes (Signed)
Pt is discharged to go home.  Discharge instructions and prescription information given 

## 2017-11-19 NOTE — Progress Notes (Signed)
[facility] Progress Note  5 Days Post-Op  Subjective: CC:  Nausea resolved. Denies abdominal pain. Tolerating clears. Had multiple (>10) small volume, liquid stools overnight. States they looked bilious. Reports walking 6-7x in hallways yesterday.  Objective: Vital signs in last 24 hours: Temp:  [99.2 F (37.3 C)-99.7 F (37.6 C)] 99.7 F (37.6 C) (04/10 0533) Pulse Rate:  [90-92] 90 (04/10 0533) Resp:  [16-18] 18 (04/10 0533) BP: (135-146)/(78-82) 137/82 (04/10 0533) SpO2:  [93 %-97 %] 93 % (04/10 0533) Last BM Date: 11/18/17  Intake/Output from previous day: 04/09 0701 - 04/10 0700 In: 5185.4 [P.O.:570; I.V.:3915.4; IV Piggyback:700] Out: -  Intake/Output this shift: No intake/output data recorded.  PE: Gen:  Alert, NAD, pleasant Pulm:  Normal effort Abd: Soft, nontender, nondistended, incisions c/d/i w/ steris, +BS Skin: warm and dry, no rashes  Psych: A&Ox3   Lab Results:  Recent Labs    11/17/17 0914  WBC 11.3*  HGB 11.8*  HCT 35.3*  PLT 284   BMET No results for input(s): NA, K, CL, CO2, GLUCOSE, BUN, CREATININE, CALCIUM in the last 72 hours. PT/INR No results for input(s): LABPROT, INR in the last 72 hours. CMP     Component Value Date/Time   NA 135 11/14/2017 0903   K 3.8 11/14/2017 0903   CL 105 11/14/2017 0903   CO2 21 (L) 11/14/2017 0903   GLUCOSE 129 (H) 11/14/2017 0903   BUN 14 11/14/2017 0903   CREATININE 1.00 11/14/2017 0903   CALCIUM 9.6 11/14/2017 0903   PROT 7.2 11/14/2017 0903   ALBUMIN 4.0 11/14/2017 0903   AST 19 11/14/2017 0903   ALT 13 (L) 11/14/2017 0903   ALKPHOS 52 11/14/2017 0903   BILITOT 0.8 11/14/2017 0903   GFRNONAA >60 11/14/2017 0903   GFRAA >60 11/14/2017 0903   Lipase     Component Value Date/Time   LIPASE 25 11/14/2017 0903       Studies/Results: No results found.  Anti-infectives: Anti-infectives (From admission, onward)   Start     Dose/Rate Route Frequency Ordered Stop   11/14/17  2300  ciprofloxacin (CIPRO) IVPB 400 mg     400 mg 200 mL/hr over 60 Minutes Intravenous Every 12 hours 11/14/17 1657     11/14/17 2000  metroNIDAZOLE (FLAGYL) IVPB 500 mg     500 mg 100 mL/hr over 60 Minutes Intravenous Every 8 hours 11/14/17 1657     11/14/17 1130  ciprofloxacin (CIPRO) IVPB 400 mg  Status:  Discontinued     400 mg 200 mL/hr over 60 Minutes Intravenous Every 12 hours 11/14/17 1128 11/14/17 1657   11/14/17 1130  metroNIDAZOLE (FLAGYL) IVPB 500 mg  Status:  Discontinued     500 mg 100 mL/hr over 60 Minutes Intravenous Every 8 hours 11/14/17 1128 11/14/17 1657       Assessment/Plan GANGRENOUS APPENDICITIS WITH PERFORATION POD#5 S/P LAPAROSCOPIC APPENDECTOMY 4/5 MW  - afebrile, VSS - ileus improving and nausea resolved - advance diet as tolerated  - low suspicion for c.dif but will check given large amount and frequency of liquid green stool while using cipro.   [id number]:SOFT diet VTE: SCD's, Lovenox ID: cipro/flagyl 4/4>> Foley: none Follow up: [facility]   Plan: ileus resolving and clinically looks much better today. advance diet. If tolerates diet patient will be stable for discharge home this afternoon vs tomorrow.    LOS: 5 days    Adam PhenixElizabeth S Simaan , [facility] 11/19/2017, 8:27 AM Pager: [phone] Consults: [phone] Mon-Fri  7:00 am-4:30 pm Sat-Sun 7:00 am-11:30 am

## 2017-11-20 ENCOUNTER — Encounter (HOSPITAL_COMMUNITY): Payer: Self-pay | Admitting: Emergency Medicine

## 2017-11-20 ENCOUNTER — Other Ambulatory Visit: Payer: Self-pay

## 2017-11-20 ENCOUNTER — Emergency Department (HOSPITAL_COMMUNITY)
Admission: EM | Admit: 2017-11-20 | Discharge: 2017-11-20 | Disposition: A | Discharge status: Home / Self Care | Payer: 59 | Attending: Emergency Medicine | Admitting: Emergency Medicine

## 2017-11-20 DIAGNOSIS — R109 Unspecified abdominal pain: Secondary | ICD-10-CM | POA: Diagnosis not present

## 2017-11-20 DIAGNOSIS — Z5321 Procedure and treatment not carried out due to patient leaving prior to being seen by health care provider: Secondary | ICD-10-CM | POA: Insufficient documentation

## 2017-11-20 LAB — COMPREHENSIVE METABOLIC PANEL
ALBUMIN: 2.8 g/dL — AB (ref 3.5–5.0)
ALT: 22 U/L (ref 17–63)
AST: 43 U/L — AB (ref 15–41)
Alkaline Phosphatase: 55 U/L (ref 38–126)
Anion gap: 10 (ref 5–15)
BUN: 10 mg/dL (ref 6–20)
CHLORIDE: 105 mmol/L (ref 101–111)
CO2: 24 mmol/L (ref 22–32)
Calcium: 8.6 mg/dL — ABNORMAL LOW (ref 8.9–10.3)
Creatinine, Ser: 0.94 mg/dL (ref 0.61–1.24)
GFR calc Af Amer: >60 mL/min (ref >60)
GFR calc non Af Amer: >60 mL/min (ref >60)
GLUCOSE: 103 mg/dL — AB (ref 65–99)
POTASSIUM: 3.4 mmol/L — AB (ref 3.5–5.1)
SODIUM: 139 mmol/L (ref 135–145)
Total Bilirubin: 0.3 mg/dL (ref 0.3–1.2)
Total Protein: 6.4 g/dL — ABNORMAL LOW (ref 6.5–8.1)

## 2017-11-20 LAB — LIPASE, BLOOD: LIPASE: 33 U/L (ref 11–51)

## 2017-11-20 LAB — CBC
HEMATOCRIT: 35.3 % — AB (ref 39.0–52.0)
Hemoglobin: 12.2 g/dL — ABNORMAL LOW (ref 13.0–17.0)
MCH: 30.5 pg (ref 26.0–34.0)
MCHC: 34.6 g/dL (ref 30.0–36.0)
MCV: 88.3 fL (ref 78.0–100.0)
Platelets: 445 10*3/uL — ABNORMAL HIGH (ref 150–400)
RBC: 4 MIL/uL — ABNORMAL LOW (ref 4.22–5.81)
RDW: 12.6 % (ref 11.5–15.5)
WBC: 11.2 10*3/uL — AB (ref 4.0–10.5)

## 2017-11-20 MED ORDER — IOPAMIDOL (ISOVUE-300) INJECTION 61%
INTRAVENOUS | Status: AC
  Filled 2017-11-20: qty 30

## 2017-11-20 NOTE — ED Notes (Signed)
Patient refused CT scan and decided to leave despite nurse's strong recommendation to stay and be evaluated .

## 2017-11-20 NOTE — ED Notes (Signed)
11/20/2017, Follow-up call completed 

## 2017-11-20 NOTE — ED Triage Notes (Signed)
Patient reports right lateral abdominal pain onset this morning , no nausea or emesis , no fever or chills . S/P appendectomy last week by Dr. Dwain SarnaWakefield .

## 2017-11-20 NOTE — ED Notes (Signed)
Consulted Dr. Manus Gunningancour ( EDP ) on pt.'s condition , new order received .

## 2017-11-21 ENCOUNTER — Telehealth: Payer: Self-pay

## 2017-12-01 ENCOUNTER — Emergency Department (HOSPITAL_COMMUNITY): Payer: No Typology Code available for payment source

## 2017-12-01 ENCOUNTER — Encounter: Payer: Self-pay | Admitting: Student

## 2017-12-01 ENCOUNTER — Encounter (HOSPITAL_COMMUNITY): Payer: Self-pay | Admitting: Emergency Medicine

## 2017-12-01 ENCOUNTER — Other Ambulatory Visit: Payer: Self-pay

## 2017-12-01 ENCOUNTER — Observation Stay (HOSPITAL_COMMUNITY)
Admission: EM | Admit: 2017-12-01 | Discharge: 2017-12-03 | Disposition: A | Discharge status: Home / Self Care | Payer: No Typology Code available for payment source | Attending: General Surgery | Admitting: General Surgery

## 2017-12-01 DIAGNOSIS — K529 Noninfective gastroenteritis and colitis, unspecified: Secondary | ICD-10-CM

## 2017-12-01 DIAGNOSIS — K651 Peritoneal abscess: Secondary | ICD-10-CM | POA: Diagnosis present

## 2017-12-01 DIAGNOSIS — Z87828 Personal history of other (healed) physical injury and trauma: Secondary | ICD-10-CM | POA: Diagnosis not present

## 2017-12-01 DIAGNOSIS — T8143XA Infection following a procedure, organ and space surgical site, initial encounter: Principal | ICD-10-CM | POA: Insufficient documentation

## 2017-12-01 DIAGNOSIS — Z79899 Other long term (current) drug therapy: Secondary | ICD-10-CM | POA: Diagnosis not present

## 2017-12-01 DIAGNOSIS — F329 Major depressive disorder, single episode, unspecified: Secondary | ICD-10-CM | POA: Diagnosis not present

## 2017-12-01 DIAGNOSIS — X58XXXA Exposure to other specified factors, initial encounter: Secondary | ICD-10-CM | POA: Diagnosis not present

## 2017-12-01 DIAGNOSIS — Z880 Allergy status to penicillin: Secondary | ICD-10-CM | POA: Diagnosis not present

## 2017-12-01 DIAGNOSIS — T8149XA Infection following a procedure, other surgical site, initial encounter: Secondary | ICD-10-CM

## 2017-12-01 DIAGNOSIS — G43909 Migraine, unspecified, not intractable, without status migrainosus: Secondary | ICD-10-CM | POA: Diagnosis not present

## 2017-12-01 DIAGNOSIS — F419 Anxiety disorder, unspecified: Secondary | ICD-10-CM | POA: Insufficient documentation

## 2017-12-01 HISTORY — DX: Infection following a procedure, organ and space surgical site, initial encounter: T81.43XA

## 2017-12-01 LAB — CBC WITH DIFFERENTIAL/PLATELET
BASOS PCT: 1 %
Basophils Absolute: 0.1 10*3/uL (ref 0.0–0.1)
EOS ABS: 0.1 10*3/uL (ref 0.0–0.7)
Eosinophils Relative: 1 %
HCT: 41.6 % (ref 39.0–52.0)
HEMOGLOBIN: 13.9 g/dL (ref 13.0–17.0)
Lymphocytes Relative: 17 %
Lymphs Abs: 2 10*3/uL (ref 0.7–4.0)
MCH: 30.4 pg (ref 26.0–34.0)
MCHC: 33.4 g/dL (ref 30.0–36.0)
MCV: 91 fL (ref 78.0–100.0)
MONOS PCT: 4 %
Monocytes Absolute: 0.5 10*3/uL (ref 0.1–1.0)
NEUTROS PCT: 77 %
Neutro Abs: 9 10*3/uL — ABNORMAL HIGH (ref 1.7–7.7)
Platelets: 652 10*3/uL — ABNORMAL HIGH (ref 150–400)
RBC: 4.57 MIL/uL (ref 4.22–5.81)
RDW: 12.7 % (ref 11.5–15.5)
WBC: 11.7 10*3/uL — AB (ref 4.0–10.5)

## 2017-12-01 LAB — COMPREHENSIVE METABOLIC PANEL
ALK PHOS: 117 U/L (ref 38–126)
ALT: 109 U/L — AB (ref 17–63)
AST: 45 U/L — ABNORMAL HIGH (ref 15–41)
Albumin: 3.6 g/dL (ref 3.5–5.0)
Anion gap: 11 (ref 5–15)
BILIRUBIN TOTAL: 0.5 mg/dL (ref 0.3–1.2)
BUN: 17 mg/dL (ref 6–20)
CALCIUM: 9.5 mg/dL (ref 8.9–10.3)
CO2: 26 mmol/L (ref 22–32)
CREATININE: 0.81 mg/dL (ref 0.61–1.24)
Chloride: 105 mmol/L (ref 101–111)
Glucose, Bld: 92 mg/dL (ref 65–99)
Potassium: 4.3 mmol/L (ref 3.5–5.1)
Sodium: 142 mmol/L (ref 135–145)
TOTAL PROTEIN: 8.3 g/dL — AB (ref 6.5–8.1)

## 2017-12-01 LAB — URINALYSIS, ROUTINE W REFLEX MICROSCOPIC
Bacteria, UA: NONE SEEN
Bilirubin Urine: NEGATIVE
GLUCOSE, UA: NEGATIVE mg/dL
Ketones, ur: NEGATIVE mg/dL
Leukocytes, UA: NEGATIVE
NITRITE: NEGATIVE
PROTEIN: NEGATIVE mg/dL
SPECIFIC GRAVITY, URINE: 1.028 (ref 1.005–1.030)
Squamous Epithelial / LPF: NONE SEEN
pH: 5 (ref 5.0–8.0)

## 2017-12-01 LAB — I-STAT CG4 LACTIC ACID, ED: Lactic Acid, Venous: 0.85 mmol/L (ref 0.5–1.9)

## 2017-12-01 MED ORDER — DIPHENHYDRAMINE HCL 50 MG/ML IJ SOLN: 25.0000 mg | Freq: Four times a day (QID) | INTRAMUSCULAR | Status: DC | PRN

## 2017-12-01 MED ORDER — SODIUM CHLORIDE 0.9 % IV SOLN
INTRAVENOUS | Status: DC
  Administered 2017-12-01: 14:00:00 via INTRAVENOUS

## 2017-12-01 MED ORDER — METRONIDAZOLE IN NACL 5-0.79 MG/ML-% IV SOLN
500.0000 mg | Freq: Three times a day (TID) | INTRAVENOUS | Status: DC
  Administered 2017-12-02 – 2017-12-03 (×4): 500 mg via INTRAVENOUS
  Filled 2017-12-01 (×5): qty 100

## 2017-12-01 MED ORDER — KCL-LACTATED RINGERS-D5W 20 MEQ/L IV SOLN
INTRAVENOUS | Status: DC
  Administered 2017-12-01 – 2017-12-03 (×2): via INTRAVENOUS
  Filled 2017-12-01 (×4): qty 1000

## 2017-12-01 MED ORDER — DIPHENHYDRAMINE HCL 25 MG PO CAPS: 25.0000 mg | ORAL_CAPSULE | Freq: Four times a day (QID) | ORAL | Status: DC | PRN

## 2017-12-01 MED ORDER — CIPROFLOXACIN IN D5W 400 MG/200ML IV SOLN
400.0000 mg | Freq: Once | INTRAVENOUS | Status: AC
  Administered 2017-12-01: 400 mg via INTRAVENOUS
  Filled 2017-12-01: qty 200

## 2017-12-01 MED ORDER — ACETAMINOPHEN 650 MG RE SUPP: 650.0000 mg | Freq: Four times a day (QID) | RECTAL | Status: DC | PRN

## 2017-12-01 MED ORDER — IOPAMIDOL (ISOVUE-300) INJECTION 61%
30.0000 mL | Freq: Once | INTRAVENOUS | Status: AC | PRN
  Administered 2017-12-01: 30 mL via ORAL

## 2017-12-01 MED ORDER — GENTAMICIN SULFATE 40 MG/ML IJ SOLN
7.0000 mg/kg | INTRAMUSCULAR | Status: AC
  Administered 2017-12-01: 590 mg via INTRAVENOUS
  Filled 2017-12-01: qty 14.75

## 2017-12-01 MED ORDER — IOPAMIDOL (ISOVUE-300) INJECTION 61%
INTRAVENOUS | Status: AC
  Filled 2017-12-01: qty 30

## 2017-12-01 MED ORDER — SODIUM CHLORIDE 0.9 % IV BOLUS
1000.0000 mL | Freq: Once | INTRAVENOUS | Status: AC
  Administered 2017-12-01: 1000 mL via INTRAVENOUS

## 2017-12-01 MED ORDER — CIPROFLOXACIN IN D5W 400 MG/200ML IV SOLN
400.0000 mg | Freq: Two times a day (BID) | INTRAVENOUS | Status: DC
  Administered 2017-12-02 – 2017-12-03 (×3): 400 mg via INTRAVENOUS
  Filled 2017-12-01 (×3): qty 200

## 2017-12-01 MED ORDER — IOPAMIDOL (ISOVUE-300) INJECTION 61%
100.0000 mL | Freq: Once | INTRAVENOUS | Status: AC | PRN
  Administered 2017-12-01: 100 mL via INTRAVENOUS

## 2017-12-01 MED ORDER — MORPHINE SULFATE (PF) 2 MG/ML IV SOLN: 2.0000 mg | INTRAVENOUS | Status: DC | PRN

## 2017-12-01 MED ORDER — IOPAMIDOL (ISOVUE-300) INJECTION 61%
INTRAVENOUS | Status: AC
  Filled 2017-12-01: qty 100

## 2017-12-01 MED ORDER — IBUPROFEN 200 MG PO TABS
600.0000 mg | ORAL_TABLET | Freq: Four times a day (QID) | ORAL | Status: DC | PRN
  Administered 2017-12-01: 600 mg via ORAL
  Filled 2017-12-01: qty 3

## 2017-12-01 MED ORDER — GENTAMICIN SULFATE 40 MG/ML IJ SOLN
7.0000 mg/kg | INTRAVENOUS | Status: DC
  Administered 2017-12-02: 590 mg via INTRAVENOUS
  Filled 2017-12-01: qty 14.75

## 2017-12-01 MED ORDER — SODIUM CHLORIDE 0.9 % IJ SOLN
INTRAMUSCULAR | Status: AC
  Filled 2017-12-01: qty 50

## 2017-12-01 MED ORDER — ACETAMINOPHEN 325 MG PO TABS
650.0000 mg | ORAL_TABLET | Freq: Four times a day (QID) | ORAL | Status: DC | PRN
  Administered 2017-12-02: 650 mg via ORAL
  Filled 2017-12-01: qty 2

## 2017-12-01 MED ORDER — METRONIDAZOLE IN NACL 5-0.79 MG/ML-% IV SOLN
500.0000 mg | Freq: Once | INTRAVENOUS | Status: AC
  Administered 2017-12-01: 500 mg via INTRAVENOUS
  Filled 2017-12-01: qty 100

## 2017-12-01 MED ORDER — MORPHINE SULFATE (PF) 2 MG/ML IV SOLN
2.0000 mg | Freq: Once | INTRAVENOUS | Status: AC
Start: 2017-12-01 — End: 2017-12-01
  Administered 2017-12-01: 2 mg via INTRAVENOUS
  Filled 2017-12-01: qty 1

## 2017-12-01 NOTE — ED Triage Notes (Signed)
Pt reports that he had blood work done on Friday and WBC 22 and told to come to ED. Had appendectomy on 4/5 that perforated. Pt reports that he hasnt eaten after midnight due to having out pt Ct scan doen today but told to come here instead due to abnormal blood work.

## 2017-12-01 NOTE — H&P (Addendum)
Samuel Herrera is an 27 y.o. male.   Chief Complaint: abdominal tenderness after appendectomy  HPI: Patient is a 27 year old male who was admitted on 11/14/17 by Dr. Rolm Bookbinder with appendicitis.  He was taken the operating room and was found to have a gangrenous perforated appendicitis.The appendix was stuck to the retroperitoneum and perforated in the proximal third.  The appendix was gangrenous.  He underwent appendectomy and the appendix was removed through the umbilicus.  He had a postoperative ileus, and was treated with Cipro and Flagyl through 11/19/17 at which time he was discharged home on Tylenol and ibuprofen.  He has some loose stools prior to discharge and was checked for C. difficile which was also negative prior to discharge. He was seen in the ED again on 11/20/2017 for abdominal pain but left prior to evaluation. He return for routine postoperative check on 11/27/17 and had some tenderness of WBC was found to be elevated.  He was sent to the ED today for further evaluation.  His discomfort is much better.  He is tolerating a diet.  Continues to have some abdominal discomfort.  Workup in the ED today shows his white count is better but still elevated 11.7, hemoglobin 13.9, hematocrit 41.6, platelets 652,000. AST slightly elevated at 45, ALT 109.  Total bilirubin 0.5.  CT scan shows interval appendectomy with development of severe wall thickening of the cecum and ascending colon consistent with colitis.  There appears to be extraluminal stones in the right lower quadrant similar in appearance to appendicoliths noted on prior study.  There are multiple small rim-enhancing fluid collections surrounding the inflamed colon suspicious for abscess.  Is also a 3.4 cm rim-enhancing fluid collection in the pelvis which is suspect for abscess.  Patient also reports he had a sexual encounter over a year ago, and is subsequently found out this person has HIV and requested testing for HIV.  Past  Medical History:  Diagnosis Date  . Anxiety    panic attacks - overwhelming feeling of school work   . Depression   . H/O multiple concussions 2005-2013 X 10   "I've had ~ 10 concussions" (11/14/2017)  . Migraine    "a few/yr" (11/14/2017)    Past Surgical History:  Procedure Laterality Date  . APPENDECTOMY  11/14/2017  . FRACTURE SURGERY    . LAPAROSCOPIC APPENDECTOMY N/A 11/14/2017   Procedure: APPENDECTOMY LAPAROSCOPIC;  Surgeon: Rolm Bookbinder, MD;  Location: [facility];  Service: General;  Laterality: N/A;  . ORIF HUMERUS FRACTURE Right 10/03/2015   Procedure: OPEN REDUCTION INTERNAL FIXATION (ORIF) RIGHT HUMERUS FRACTURE WITH RADIAL NERVE NEUROLYSIS, REPAIR AND RECONTRUCT AS NECESSARY;  Surgeon: Roseanne Kaufman, MD;  Location: [place];  Service: Orthopedics;  Laterality: Right;  . WRIST SURGERY Right 2004   "took bone from wrist and put in middle finger that had been broken"    No family history on file. Social History:  reports that he has never smoked. He has never used smokeless tobacco. He reports that he drinks about 8.4 oz of alcohol per week. He reports that he has current or past drug history. Drug: Marijuana.  Tobacco:  Occasional Drugs:  Marijuana almost daily ETOH:  Up to 2 drinks per day, very little since discharge  Single Works and goes to school    Allergies:  Allergies  Allergen Reactions  . Amoxicillin Hives  . Penicillins Hives    Prior to Admission medications   Medication Sig Start Date End Date Taking? Authorizing Provider  acetaminophen (TYLENOL) 325 MG tablet Take 2 tablets (650 mg total) by mouth every 6 (six) hours as needed for mild pain or fever. 11/19/17  Yes Simaan, Darci Current, PA-C  ibuprofen (ADVIL,MOTRIN) 400 MG tablet Take 1-2 tablets (400-800 mg total) by mouth every 8 (eight) hours as needed for headache, mild pain or moderate pain. 11/19/17  Yes Jill Alexanders, PA-C     Results for orders placed or performed during the hospital  encounter of 12/01/17 (from the past 48 hour(s))  Comprehensive metabolic panel     Status: Abnormal   Collection Time: 12/01/17 12:54 PM  Result Value Ref Range   Sodium 142 135 - 145 mmol/L   Potassium 4.3 3.5 - 5.1 mmol/L   Chloride 105 101 - 111 mmol/L   CO2 26 22 - 32 mmol/L   Glucose, Bld 92 65 - 99 mg/dL   BUN 17 6 - 20 mg/dL   Creatinine, Ser 0.81 0.61 - 1.24 mg/dL   Calcium 9.5 8.9 - 10.3 mg/dL   Total Protein 8.3 (H) 6.5 - 8.1 g/dL   Albumin 3.6 3.5 - 5.0 g/dL   AST 45 (H) 15 - 41 U/L   ALT 109 (H) 17 - 63 U/L   Alkaline Phosphatase 117 38 - 126 U/L   Total Bilirubin 0.5 0.3 - 1.2 mg/dL   GFR calc non Af Amer >60 >60 mL/min   GFR calc Af Amer >60 >60 mL/min    Comment: (NOTE) The eGFR has been calculated using the CKD EPI equation. This calculation has not been validated in all clinical situations. eGFR's persistently <60 mL/min signify possible Chronic Kidney Disease.    Anion gap 11 5 - 15    Comment: Performed at [facility], [place] [address] [place], [place] [address]  CBC with Differential     Status: Abnormal   Collection Time: 12/01/17 12:54 PM  Result Value Ref Range   WBC 11.7 (H) 4.0 - 10.5 K/uL   RBC 4.57 4.22 - 5.81 MIL/uL   Hemoglobin 13.9 13.0 - 17.0 g/dL   HCT 41.6 39.0 - 52.0 %   MCV 91.0 78.0 - 100.0 fL   MCH 30.4 26.0 - 34.0 pg   MCHC 33.4 30.0 - 36.0 g/dL   RDW 12.7 11.5 - 15.5 %   Platelets 652 (H) 150 - 400 K/uL   Neutrophils Relative % 77 %   Neutro Abs 9.0 (H) 1.7 - 7.7 K/uL   Lymphocytes Relative 17 %   Lymphs Abs 2.0 0.7 - 4.0 K/uL   Monocytes Relative 4 %   Monocytes Absolute 0.5 0.1 - 1.0 K/uL   Eosinophils Relative 1 %   Eosinophils Absolute 0.1 0.0 - 0.7 K/uL   Basophils Relative 1 %   Basophils Absolute 0.1 0.0 - 0.1 K/uL    Comment: Performed at [facility], [place] [address] [place], [place] [address]  I-Stat CG4 Lactic Acid, ED     Status: None   Collection Time: 12/01/17  1:00 PM   Result Value Ref Range   Lactic Acid, Venous 0.85 0.5 - 1.9 mmol/L  Urinalysis, Routine w reflex microscopic     Status: Abnormal   Collection Time: 12/01/17  1:59 PM  Result Value Ref Range   Color, Urine YELLOW YELLOW   APPearance CLEAR CLEAR   Specific Gravity, Urine 1.028 1.005 - 1.030   pH 5.0 5.0 - 8.0   Glucose, UA NEGATIVE NEGATIVE mg/dL   Hgb urine dipstick SMALL (A)  NEGATIVE   Bilirubin Urine NEGATIVE NEGATIVE   Ketones, ur NEGATIVE NEGATIVE mg/dL   Protein, ur NEGATIVE NEGATIVE mg/dL   Nitrite NEGATIVE NEGATIVE   Leukocytes, UA NEGATIVE NEGATIVE   RBC / HPF 0-5 0 - 5 RBC/hpf   WBC, UA 0-5 0 - 5 WBC/hpf   Bacteria, UA NONE SEEN NONE SEEN   Squamous Epithelial / LPF NONE SEEN NONE SEEN   Mucus PRESENT     Comment: Performed at [facility], [place] [address] [place], [place] [address]   Ct Abdomen Pelvis W Contrast  Result Date: 12/01/2017 CLINICAL DATA:  Abdominal distension right lower quadrant pain with history of ruptured appendix EXAM: CT ABDOMEN AND PELVIS WITH CONTRAST TECHNIQUE: Multidetector CT imaging of the abdomen and pelvis was performed using the standard protocol following bolus administration of intravenous contrast. CONTRAST:  100 cc Isovue-300 intravenous COMPARISON:  11/14/2017. FINDINGS: Lower chest: No acute abnormality. Hepatobiliary: No focal liver abnormality is seen. No gallstones, gallbladder wall thickening, or biliary dilatation. Pancreas: Unremarkable. No pancreatic ductal dilatation or surrounding inflammatory changes. Spleen: Normal in size without focal abnormality. Adrenals/Urinary Tract: Adrenal glands are unremarkable. Kidneys are normal, without renal calculi, focal lesion, or hydronephrosis. Bladder is unremarkable. Stomach/Bowel: Stomach within normal limits. No dilated small bowel. Interval appendectomy. Interim development of significant wall thickening of the cecum and ascending colon with moderate residual inflammatory  changes around the cecum and ascending colon. Small rim enhancing extraluminal fluid collections in the right lower quadrant measuring up to 18 mm. Appearance of calcified stones in the right lower quadrant which appear extraluminal, series 2, image number 36 and series 2, image number 42. The more inferior stone appears to be associated with irregular rim enhancing fluid collection measuring up to 3.1 cm. Vascular/Lymphatic: Nonaneurysmal aorta. Multiple right lower quadrant mesenteric lymph nodes measuring up to 8 mm. Reproductive: Prostate is unremarkable. Other: Negative for free air. Small rim enhancing fluid collection in the pelvis measuring 3.4 by 1.6 cm. Musculoskeletal: No acute or significant osseous findings. IMPRESSION: 1. Interval appendectomy. Development of severe wall thickening of the cecum and ascending colon, consistent with colitis. There appear to be extraluminal stones in the right lower quadrant as described above which have similar appearance to the appendicoliths noted on the prior study. In addition, there are multiple small rim enhancing fluid collections surrounding the inflamed colon suspicious for abscess. Small 3.4 cm rim enhancing fluid collection in the pelvis also suspect for abscess. 2. Negative for bowel obstruction 3. Multiple right lower quadrant mesenteric lymph nodes, likely reactive. Electronically Signed   By: Donavan Foil M.D.   On: 12/01/2017 16:21    Review of Systems  Constitutional: Positive for fever (11/28/17) and weight loss. Negative for chills and malaise/fatigue.  HENT: Negative.   Eyes: Negative.   Respiratory: Negative.   Cardiovascular: Negative.   Gastrointestinal: Positive for abdominal pain (minimal tenderness RLQ on exam now, better than last week when seen) and nausea (none since discharge). Negative for blood in stool, constipation, diarrhea, heartburn, melena and vomiting.  Genitourinary: Negative.   Musculoskeletal: Negative.   Skin:  Negative.   Neurological: Negative.   Endo/Heme/Allergies: Negative.   Psychiatric/Behavioral: Positive for depression (improved). The patient is nervous/anxious (hx of anxiety/depression).     Blood pressure 134/79, pulse 82, temperature 98.5 F (36.9 C), temperature source Oral, resp. rate 18, height '6\' 1"'$  (1.854 m), weight 84.4 kg (186 lb), SpO2 100 %. Physical Exam  Constitutional: He is oriented to person, place, and time.  He appears well-developed and well-nourished. No distress.  HENT:  Head: Normocephalic and atraumatic.  Mouth/Throat: Oropharynx is clear and moist. No oropharyngeal exudate.  Eyes: Right eye exhibits no discharge. Left eye exhibits no discharge. Scleral icterus is present.  Pupils are equal  Neck: Normal range of motion. Neck supple. No JVD present. No tracheal deviation present. No thyromegaly present.  Cardiovascular: Normal rate, regular rhythm, normal heart sounds and intact distal pulses.  No murmur heard. Respiratory: Effort normal and breath sounds normal. No respiratory distress. He has no wheezes. He has no rales. He exhibits no tenderness.  GI: Soft. Bowel sounds are normal. He exhibits distension (minimal tenderness). He exhibits no mass. There is tenderness (minimal tenderness). There is no rebound and no guarding.  Musculoskeletal: He exhibits no edema or tenderness.  Lymphadenopathy:    He has no cervical adenopathy.  Neurological: He is alert and oriented to person, place, and time. No cranial nerve deficit. Coordination normal.  Skin: Skin is warm and dry. No rash noted. He is not diaphoretic. No erythema. No pallor.  Psychiatric: He has a normal mood and affect. His behavior is normal. Judgment and thought content normal.     Assessment/Plan  S/p laparoscopic appendectomy for gangrenous perforated  Appendicitis 11/14/17 Post op ileus/diarrhea - C diff colitis negative Hx of ETOH/MJ use  Post op colitis, possible pelvic abscess, possible  retained appendicoliths Possible HIV exposure - new information - HIV pending FEN: full liquids tonight/NPO after MN ID:  Restart Cipro/Flagyl DVT:  SCD - hold anticoagulation till seen by IR  Plan:  Admit, restart IV antibiotics, IV fluids, IR evaluation in AM.       Onnie Alatorre, PA-C 12/01/2017, 4:46 PM

## 2017-12-01 NOTE — Progress Notes (Signed)
Samuel Herrera Documented: 11/27/2017 3:14 PM Location: [facility] Patient #: [id number] DOB: 1990/12/22 Single / Language: [place] / Race: White Male   History of Present Illness  The patient is a 27 year old male presenting status-post appendectomy. Emergent laparoscopic appendectomy was performed on 11/14/17 by Dr. Dwain Herrera. Discharged post-op day 5 after his postop ileus resolved. He also had numerous loose stools prior to discharge so C. difficile was obtained which was negative. He presented to the ER the following day, on 11/20/17, for abdominal pain but left before the workup could be initiated. He states he has right lower quadrant abdominal cramping and. Umbilical pain occasionally. 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 He is taking Tylenol for pain relief. Tolerating diet well and denies diarrhea and constipation. Denies issues with urination. Denies fever, nausea, or vomiting.    Past Surgical History  Appendectomy   Diagnostic Studies History  Colonoscopy  never  Allergies No Known Drug Allergies [11/27/2017]: Allergies Reconciled   Medication History  No Current Medications Medications Reconciled  Social History  Alcohol use  Occasional alcohol use. Illicit drug use  Uses socially only. No caffeine use  Tobacco use  Former smoker.  Family History Arthritis  Father, Mother. Depression  Father. Hypertension  Father. Melanoma  Mother. Seizure disorder  Father. Thyroid problems  Father.  Other Problems  Anxiety Disorder  Migraine Headache    Review of Systems General Not Present- Appetite Loss, Chills, Fatigue, Fever, Night Sweats, Weight Gain and Weight Loss. Skin Not Present- Change in Wart/Mole, Dryness, Hives, Jaundice, New Lesions, Non-Healing Wounds, Rash and Ulcer. HEENT Present- Wears glasses/contact lenses. Not Present- Earache, Hearing Loss, Hoarseness, Nose Bleed,  Oral Ulcers, Ringing in the Ears, Seasonal Allergies, Sinus Pain, Sore Throat, Visual Disturbances and Yellow Eyes. Gastrointestinal Present- Abdominal Pain. Not Present- Bloating, Bloody Stool, Change in Bowel Habits, Chronic diarrhea, Constipation, Difficulty Swallowing, Excessive gas, Gets full quickly at meals, Hemorrhoids, Indigestion, Nausea, Rectal Pain and Vomiting. Male Genitourinary Present- Painful Urination. Not Present- Blood in Urine, Change in Urinary Stream, Frequency, Impotence, Nocturia, Urgency and Urine Leakage. Musculoskeletal Present- Back Pain. Not Present- Joint Pain, Joint Stiffness, Muscle Pain, Muscle Weakness and Swelling of Extremities.  Vitals  11/27/2017 3:14 PM Weight: 186.6 lb Height: 71in Body Surface Area: 2.05 m Body Mass Index: 26.03 kg/m  Temp.: 98.19F  Pulse: 78 (Regular)  BP: 130/88 (Sitting, Left Arm, Standard)    Physical Exam  General: Alert, oriented, in no acute distress Chest and Lung Exam: Effort normal Abdomen: Soft, non-distended. Tenderness to RLQ and periumbilical. Incisions dry and intact. No surrounding erythema or drainage.   Assessment & Plan  ACUTE APPENDICITIS (K35.80) S/P LAPAROSCOPIC APPENDECTOMY (Z90.49) Impression: Patient is doing well and healing appropriately without obvious signs of infection. I believe his abdominal pain is related to overexerting himself the past 4 days at work. However, I will obtain a CBC today to rule out any major infection. He knows to seek medical attention over the weekend if he has any new or worsening symptoms. We discussed gradually increasing activities as tolerated.   Current Plans CBC, PLATELETS & AUT DIFF (04540) Signed electronically by Tsosie Billing, PA C (11/27/2017 5:13 PM)  Addendum: I called [name] at 4:20PM on 11/30/17 to inform his that his WBC count is 22.5. He stated he had a fever of 100.5 two days ago but since then he has been afebrile. Denies N/V. States that  yesterday he was not in much pain and had a "much better day." He reports a dull pain on his right abdomen but states it is "not unbearable." Since he reports being afebrile and pain is managable, I advised him to monitor his symptoms tonight and stay NPO after midnight. We will arrange for an outpatient CT abd/pelvis with contrast tomorrow to r/o post-op complications such as an abscess.  He agreed to go to the ER tonight if he has any new or worsening symptoms so he can be evaluated more quickly.   After speaking to a surgeon in the office this morning (12/01/17), it was advised that the patient go to the ER today to have the CT scan done and to repeat blood work since a WBC of 22 will most likely need some type of intervention. I called the patient and he agreed with the plan.   He also voiced to me over the phone that he had intercourse with someone 1.5 years ago who was recently diagnosed with HIV.  I do not see where an HIV test was obtained during his last visit so I advised him to discuss this with the ER provider as well.   Hedda Sladeuja Jaylia Pettus, PA-C 12/01/17   9:00AM

## 2017-12-01 NOTE — ED Provider Notes (Signed)
Pt received at sign out with CT A/P pending.  CT as below; IV abx ordered. Dx and testing d/w pt and family.  Questions answered.  Verb understanding, agreeable to admit. 1640:  T/C returned from General Surgery APP Will Marlyne Beards, case discussed, including:  HPI, pertinent PM/SHx, VS/PE, dx testing, ED course and treatment:  Agreeable to come to ED for evaluation for admission.    Patient Vitals for the past 24 hrs:  BP Temp Temp src Pulse Resp SpO2 Height Weight  12/01/17 1357 134/79 98.5 F (36.9 C) Oral 82 18 100 % - -  12/01/17 0935 138/76 98.5 F (36.9 C) Oral 89 18 100 % 6\' 1"  (1.854 m) 84.4 kg (186 lb)     Results for orders placed or performed during the hospital encounter of 12/01/17  Comprehensive metabolic panel  Result Value Ref Range   Sodium 142 135 - 145 mmol/L   Potassium 4.3 3.5 - 5.1 mmol/L   Chloride 105 101 - 111 mmol/L   CO2 26 22 - 32 mmol/L   Glucose, Bld 92 65 - 99 mg/dL   BUN 17 6 - 20 mg/dL   Creatinine, Ser 9.60 0.61 - 1.24 mg/dL   Calcium 9.5 8.9 - 45.4 mg/dL   Total Protein 8.3 (H) 6.5 - 8.1 g/dL   Albumin 3.6 3.5 - 5.0 g/dL   AST 45 (H) 15 - 41 U/L   ALT 109 (H) 17 - 63 U/L   Alkaline Phosphatase 117 38 - 126 U/L   Total Bilirubin 0.5 0.3 - 1.2 mg/dL   GFR calc non Af Amer >60 >60 mL/min   GFR calc Af Amer >60 >60 mL/min   Anion gap 11 5 - 15  CBC with Differential  Result Value Ref Range   WBC 11.7 (H) 4.0 - 10.5 K/uL   RBC 4.57 4.22 - 5.81 MIL/uL   Hemoglobin 13.9 13.0 - 17.0 g/dL   HCT 09.8 11.9 - 14.7 %   MCV 91.0 78.0 - 100.0 fL   MCH 30.4 26.0 - 34.0 pg   MCHC 33.4 30.0 - 36.0 g/dL   RDW 82.9 56.2 - 13.0 %   Platelets 652 (H) 150 - 400 K/uL   Neutrophils Relative % 77 %   Neutro Abs 9.0 (H) 1.7 - 7.7 K/uL   Lymphocytes Relative 17 %   Lymphs Abs 2.0 0.7 - 4.0 K/uL   Monocytes Relative 4 %   Monocytes Absolute 0.5 0.1 - 1.0 K/uL   Eosinophils Relative 1 %   Eosinophils Absolute 0.1 0.0 - 0.7 K/uL   Basophils Relative 1 %    Basophils Absolute 0.1 0.0 - 0.1 K/uL  Urinalysis, Routine w reflex microscopic  Result Value Ref Range   Color, Urine YELLOW YELLOW   APPearance CLEAR CLEAR   Specific Gravity, Urine 1.028 1.005 - 1.030   pH 5.0 5.0 - 8.0   Glucose, UA NEGATIVE NEGATIVE mg/dL   Hgb urine dipstick SMALL (A) NEGATIVE   Bilirubin Urine NEGATIVE NEGATIVE   Ketones, ur NEGATIVE NEGATIVE mg/dL   Protein, ur NEGATIVE NEGATIVE mg/dL   Nitrite NEGATIVE NEGATIVE   Leukocytes, UA NEGATIVE NEGATIVE   RBC / HPF 0-5 0 - 5 RBC/hpf   WBC, UA 0-5 0 - 5 WBC/hpf   Bacteria, UA NONE SEEN NONE SEEN   Squamous Epithelial / LPF NONE SEEN NONE SEEN   Mucus PRESENT   I-Stat CG4 Lactic Acid, ED  Result Value Ref Range   Lactic Acid, Venous 0.85 0.5 -  1.9 mmol/L   Ct Abdomen Pelvis W Contrast Result Date: 12/01/2017 CLINICAL DATA:  Abdominal distension right lower quadrant pain with history of ruptured appendix EXAM: CT ABDOMEN AND PELVIS WITH CONTRAST TECHNIQUE: Multidetector CT imaging of the abdomen and pelvis was performed using the standard protocol following bolus administration of intravenous contrast. CONTRAST:  100 cc Isovue-300 intravenous COMPARISON:  11/14/2017. FINDINGS: Lower chest: No acute abnormality. Hepatobiliary: No focal liver abnormality is seen. No gallstones, gallbladder wall thickening, or biliary dilatation. Pancreas: Unremarkable. No pancreatic ductal dilatation or surrounding inflammatory changes. Spleen: Normal in size without focal abnormality. Adrenals/Urinary Tract: Adrenal glands are unremarkable. Kidneys are normal, without renal calculi, focal lesion, or hydronephrosis. Bladder is unremarkable. Stomach/Bowel: Stomach within normal limits. No dilated small bowel. Interval appendectomy. Interim development of significant wall thickening of the cecum and ascending colon with moderate residual inflammatory changes around the cecum and ascending colon. Small rim enhancing extraluminal fluid collections in  the right lower quadrant measuring up to 18 mm. Appearance of calcified stones in the right lower quadrant which appear extraluminal, series 2, image number 36 and series 2, image number 42. The more inferior stone appears to be associated with irregular rim enhancing fluid collection measuring up to 3.1 cm. Vascular/Lymphatic: Nonaneurysmal aorta. Multiple right lower quadrant mesenteric lymph nodes measuring up to 8 mm. Reproductive: Prostate is unremarkable. Other: Negative for free air. Small rim enhancing fluid collection in the pelvis measuring 3.4 by 1.6 cm. Musculoskeletal: No acute or significant osseous findings. IMPRESSION: 1. Interval appendectomy. Development of severe wall thickening of the cecum and ascending colon, consistent with colitis. There appear to be extraluminal stones in the right lower quadrant as described above which have similar appearance to the appendicoliths noted on the prior study. In addition, there are multiple small rim enhancing fluid collections surrounding the inflamed colon suspicious for abscess. Small 3.4 cm rim enhancing fluid collection in the pelvis also suspect for abscess. 2. Negative for bowel obstruction 3. Multiple right lower quadrant mesenteric lymph nodes, likely reactive. Electronically Signed   By: Jasmine PangKim  Fujinaga M.D.   On: 12/01/2017 16:21       Samuel JesterMcManus, Jayley Hustead, DO 12/01/17 1649

## 2017-12-01 NOTE — ED Provider Notes (Signed)
[facility]-EMERGENCY DEPT Provider Note   CSN: [id number] Arrival date & time: 12/01/17  [id number]     History   Chief Complaint Chief Complaint  Patient presents with  . abnormal labs    HPI Samuel Herrera is a 27 y.o. male.  27 year old male presents with worsening right lower quadrant abdominal pain times several days.  Patient had a ruptured appendix several weeks ago and had a laparotomy for that.  Since that time he has had waxing and waning dull pain to his to his right lower quadrant without vomiting but fever up to 100.5.  Patient has been self-medicating with Tylenol and Motrin with alternating doses seen by his physician for this 3 days ago and had a white count of 22,000.  Called his surgeon today and was told to come in for repeat white count and possible abdominal imaging.  Denies any urinary symptoms.  No cough or congestion.     Past Medical History:  Diagnosis Date  . Anxiety    panic attacks - overwhelming feeling of school work   . Depression   . H/O multiple concussions 2005-2013 X 10   "I've had ~ 10 concussions" (11/14/2017)  . Migraine    "a few/yr" (11/14/2017)    Patient Active Problem List   Diagnosis Date Noted  . Perforated appendicitis 11/14/2017  . Appendicitis 11/14/2017  . Fracture closed, humerus, shaft 10/03/2015    Past Surgical History:  Procedure Laterality Date  . APPENDECTOMY  11/14/2017  . FRACTURE SURGERY    . LAPAROSCOPIC APPENDECTOMY N/A 11/14/2017   Procedure: APPENDECTOMY LAPAROSCOPIC;  Surgeon: Emelia LoronWakefield, Matthew, MD;  [facility];  Service: General;  Laterality: N/A;  . ORIF HUMERUS FRACTURE Right 10/03/2015   Procedure: OPEN REDUCTION INTERNAL FIXATION (ORIF) RIGHT HUMERUS FRACTURE WITH RADIAL NERVE NEUROLYSIS, REPAIR AND RECONTRUCT AS NECESSARY;  Surgeon: Dominica SeverinWilliam Gramig, MD;  Location: [facility] OR;  Service: Orthopedics;  Laterality: Right;  . WRIST SURGERY Right 2004   "took bone from wrist and put in middle  finger that had been broken"        Home Medications    Prior to Admission medications   Medication Sig Start Date End Date Taking? Authorizing Provider  acetaminophen (TYLENOL) 325 MG tablet Take 2 tablets (650 mg total) by mouth every 6 (six) hours as needed for mild pain or fever. 11/19/17   Adam PhenixSimaan, Elizabeth S, PA-C  ibuprofen (ADVIL,MOTRIN) 400 MG tablet Take 1-2 tablets (400-800 mg total) by mouth every 8 (eight) hours as needed for headache, mild pain or moderate pain. 11/19/17   Adam PhenixSimaan, Elizabeth S, PA-C    Family History No family history on file.  Social History Social History   Tobacco Use  . Smoking status: Never Smoker  . Smokeless tobacco: Never Used  Substance Use Topics  . Alcohol use: Yes    Alcohol/week: 8.4 oz    Types: 14 Cans of beer per week  . Drug use: Yes    Types: Marijuana    Comment: 11/14/2017 "daily"     Allergies   Amoxicillin and Penicillins   Review of Systems Review of Systems  All other systems reviewed and are negative.    Physical Exam Updated Vital Signs BP 138/76 (BP Location: Right Arm)   Pulse 89   Temp 98.5 F (36.9 C) (Oral)   Resp 18   Ht 1.854 m (6\' 1" )   Wt 84.4 kg (186 lb)   SpO2 100%   BMI 24.54 kg/m  Physical Exam  Constitutional: He is oriented to person, place, and time. He appears well-developed and well-nourished.  Non-toxic appearance. No distress.  HENT:  Head: Normocephalic and atraumatic.  Eyes: Pupils are equal, round, and reactive to light. Conjunctivae, EOM and lids are normal.  Neck: Normal range of motion. Neck supple. No tracheal deviation present. No thyroid mass present.  Cardiovascular: Normal rate, regular rhythm and normal heart sounds. Exam reveals no gallop.  No murmur heard. Pulmonary/Chest: Effort normal and breath sounds normal. No stridor. No respiratory distress. He has no decreased breath sounds. He has no wheezes. He has no rhonchi. He has no rales.  Abdominal: Soft. Normal  appearance and bowel sounds are normal. He exhibits no distension. There is tenderness in the right lower quadrant. There is guarding. There is no rigidity, no rebound and no CVA tenderness.    Musculoskeletal: Normal range of motion. He exhibits no edema or tenderness.  Neurological: He is alert and oriented to person, place, and time. He has normal strength. No cranial nerve deficit or sensory deficit. GCS eye subscore is 4. GCS verbal subscore is 5. GCS motor subscore is 6.  Skin: Skin is warm and dry. No abrasion and no rash noted.  Psychiatric: He has a normal mood and affect. His speech is normal and behavior is normal.  Nursing note and vitals reviewed.    ED Treatments / Results  Labs (all labs ordered are listed, but only abnormal results are displayed) Labs Reviewed  COMPREHENSIVE METABOLIC PANEL - Abnormal; Notable for the following components:      Result Value   Total Protein 8.3 (*)    AST 45 (*)    ALT 109 (*)    All other components within normal limits  CBC WITH DIFFERENTIAL/PLATELET - Abnormal; Notable for the following components:   WBC 11.7 (*)    Platelets 652 (*)    Neutro Abs 9.0 (*)    All other components within normal limits  URINALYSIS, ROUTINE W REFLEX MICROSCOPIC  HIV ANTIBODY (ROUTINE TESTING)  I-STAT CG4 LACTIC ACID, ED  I-STAT CG4 LACTIC ACID, ED    EKG None  Radiology No results found.  Procedures Procedures (including critical care time)  Medications Ordered in ED Medications  sodium chloride 0.9 % bolus 1,000 mL (has no administration in time range)  0.9 %  sodium chloride infusion (has no administration in time range)  morphine 2 MG/ML injection 2 mg (has no administration in time range)     Initial Impression / Assessment and Plan / ED Course  I have reviewed the triage vital signs and the nursing notes.  Pertinent labs & imaging results that were available during my care of the patient were reviewed by me and considered in my  medical decision making (see chart for details).     Pt tx with iv fluids and pain meds abd ct pending Care signed out to dr Clarene Duke  Final Clinical Impressions(s) / ED Diagnoses   Final diagnoses:  None    ED Discharge Orders    None       Lorre Nick, MD 12/01/17 1545

## 2017-12-01 NOTE — ED Notes (Signed)
Dr Ezzard StandingNewman called stating that patient will need CT scan of abd depending on his CRT level with contrast or not to rule out possible abscess after recent appendectomy.

## 2017-12-01 NOTE — Progress Notes (Signed)
Patient gave permission for questions on nursing admission history to be asked in front of his visitor. Samuel Herrera. Carleane Hellena Pridgen BSN, RN-BC Admissions RN 12/01/2017 6:26 PM

## 2017-12-01 NOTE — Progress Notes (Signed)
Pharmacy Antibiotic Note  Samuel Herrera is a 27 y.o. male admitted on 12/01/2017 with intra-abdominal infection.  Pharmacy has been consulted for gentamicin dosing.  Patient recently admitted on 11/14/17 with gangrenous perforated appendix and underwent surgery.   Plan: Gentamicin 590 mg (7 mg/kg) IV extended interval q24h Will check gentamicin level 10 hours post-dose and assess per [place] [facility].  Height: 6\' 1"  (185.4 cm) Weight: 186 lb (84.4 kg) IBW/kg (Calculated) : 79.9  Temp (24hrs), Avg:98.5 F (36.9 C), Min:98.5 F (36.9 C), Max:98.5 F (36.9 C)  Recent Labs  Lab 12/01/17 1254 12/01/17 1300  WBC 11.7*  --   CREATININE 0.81  --   LATICACIDVEN  --  0.85    Estimated Creatinine Clearance: 156.2 mL/min (by C-G formula based on SCr of 0.81 mg/dL).    Allergies  Allergen Reactions  . Amoxicillin Hives  . Penicillins Hives   Antimicrobials this admission: gentamicin 4/22 >>  ciprofloxacin 4/22 >>  Metronidazole 4/22 >>  Dose adjustments this admission:  Microbiology results: None  Thank you for allowing pharmacy to be a part of this patient's care.  Cindi CarbonMary M Mansoor Hillyard, PharmD, BCPS Clinical Pharmacist 12/01/2017 6:04 PM

## 2017-12-01 NOTE — ED Notes (Signed)
Blue save blood tube in main lab

## 2017-12-01 NOTE — ED Notes (Signed)
ED TO INPATIENT HANDOFF REPORT  Name/Age/Gender Samuel Herrera 27 y.o. male  Code Status    Code Status Orders  (From admission, onward)        Start     Ordered   12/01/17 1739  Full code  Continuous     12/01/17 1743    Code Status History    Date Active Date Inactive Code Status Order ID Comments User Context   11/14/2017 1658 11/19/2017 1857 Full Code [id number]  Rolm Bookbinder, MD Inpatient   11/14/2017 1657 11/14/2017 1657 Full Code [id number]  Rolm Bookbinder, MD Inpatient      Home/SNF/Other Home  Chief Complaint abnormal labs  Level of Care/Admitting Diagnosis ED Disposition    ED Disposition Condition [facility] Area: [facility] [[id number]]  Level of Care: Med-Surg [16]  Diagnosis: Postprocedural intraabdominal abscess [8338250]  Admitting Physician: CCS, [place]  Attending Physician: CCS, MD [[id number]]  PT Class (Do Not Modify): Observation [104]  PT Acc Code (Do Not Modify): Observation [10022]       Medical History Past Medical History:  Diagnosis Date  . Anxiety    panic attacks - overwhelming feeling of school work   . Depression   . H/O multiple concussions 2005-2013 X 10   "I've had ~ 10 concussions" (11/14/2017)  . Migraine    "a few/yr" (11/14/2017)    Allergies Allergies  Allergen Reactions  . Amoxicillin Hives  . Penicillins Hives    IV Location/Drains/Wounds Patient Lines/Drains/Airways Status   Active Line/Drains/Airways    Name:   Placement date:   Placement time:   Site:   Days:   Peripheral IV 11/20/17 Left Antecubital   11/20/17    0505    Antecubital   11   Peripheral IV 12/01/17 Right Antecubital   12/01/17    1401    Antecubital   less than 1   Incision (Closed) 10/03/15 Arm Right   10/03/15    1718     790   Incision (Closed) 11/14/17 Abdomen Other (Comment)   11/14/17    1353     17   Incision - 3 Ports Abdomen 1: Umbilicus 2: Left;Mid 3: Left;Lower   11/14/17    1404     17           Labs/Imaging Results for orders placed or performed during the hospital encounter of 12/01/17 (from the past 48 hour(s))  Comprehensive metabolic panel     Status: Abnormal   Collection Time: 12/01/17 12:54 PM  Result Value Ref Range   Sodium 142 135 - 145 mmol/L   Potassium 4.3 3.5 - 5.1 mmol/L   Chloride 105 101 - 111 mmol/L   CO2 26 22 - 32 mmol/L   Glucose, Bld 92 65 - 99 mg/dL   BUN 17 6 - 20 mg/dL   Creatinine, Ser 0.81 0.61 - 1.24 mg/dL   Calcium 9.5 8.9 - 10.3 mg/dL   Total Protein 8.3 (H) 6.5 - 8.1 g/dL   Albumin 3.6 3.5 - 5.0 g/dL   AST 45 (H) 15 - 41 U/L   ALT 109 (H) 17 - 63 U/L   Alkaline Phosphatase 117 38 - 126 U/L   Total Bilirubin 0.5 0.3 - 1.2 mg/dL   GFR calc non Af Amer >60 >60 mL/min   GFR calc Af Amer >60 >60 mL/min    Comment: (NOTE) The eGFR has been calculated using the CKD EPI equation. This calculation has not  been validated in all clinical situations. eGFR's persistently <60 mL/min signify possible Chronic Kidney Disease.    Anion gap 11 5 - 15    Comment: Performed at [facility], [place] [address] [place], [place] [address]  CBC with Differential     Status: Abnormal   Collection Time: 12/01/17 12:54 PM  Result Value Ref Range   WBC 11.7 (H) 4.0 - 10.5 K/uL   RBC 4.57 4.22 - 5.81 MIL/uL   Hemoglobin 13.9 13.0 - 17.0 g/dL   HCT 41.6 39.0 - 52.0 %   MCV 91.0 78.0 - 100.0 fL   MCH 30.4 26.0 - 34.0 pg   MCHC 33.4 30.0 - 36.0 g/dL   RDW 12.7 11.5 - 15.5 %   Platelets 652 (H) 150 - 400 K/uL   Neutrophils Relative % 77 %   Neutro Abs 9.0 (H) 1.7 - 7.7 K/uL   Lymphocytes Relative 17 %   Lymphs Abs 2.0 0.7 - 4.0 K/uL   Monocytes Relative 4 %   Monocytes Absolute 0.5 0.1 - 1.0 K/uL   Eosinophils Relative 1 %   Eosinophils Absolute 0.1 0.0 - 0.7 K/uL   Basophils Relative 1 %   Basophils Absolute 0.1 0.0 - 0.1 K/uL    Comment: Performed at [facility], [place] [address], [place], [place] [address]  I-Stat  CG4 Lactic Acid, ED     Status: None   Collection Time: 12/01/17  1:00 PM  Result Value Ref Range   Lactic Acid, Venous 0.85 0.5 - 1.9 mmol/L  Urinalysis, Routine w reflex microscopic     Status: Abnormal   Collection Time: 12/01/17  1:59 PM  Result Value Ref Range   Color, Urine YELLOW YELLOW   APPearance CLEAR CLEAR   Specific Gravity, Urine 1.028 1.005 - 1.030   pH 5.0 5.0 - 8.0   Glucose, UA NEGATIVE NEGATIVE mg/dL   Hgb urine dipstick SMALL (A) NEGATIVE   Bilirubin Urine NEGATIVE NEGATIVE   Ketones, ur NEGATIVE NEGATIVE mg/dL   Protein, ur NEGATIVE NEGATIVE mg/dL   Nitrite NEGATIVE NEGATIVE   Leukocytes, UA NEGATIVE NEGATIVE   RBC / HPF 0-5 0 - 5 RBC/hpf   WBC, UA 0-5 0 - 5 WBC/hpf   Bacteria, UA NONE SEEN NONE SEEN   Squamous Epithelial / LPF NONE SEEN NONE SEEN   Mucus PRESENT     Comment: Performed at [facility], [place] [address] [place], [place] [address]   Ct Abdomen Pelvis W Contrast  Result Date: 12/01/2017 CLINICAL DATA:  Abdominal distension right lower quadrant pain with history of ruptured appendix EXAM: CT ABDOMEN AND PELVIS WITH CONTRAST TECHNIQUE: Multidetector CT imaging of the abdomen and pelvis was performed using the standard protocol following bolus administration of intravenous contrast. CONTRAST:  100 cc Isovue-300 intravenous COMPARISON:  11/14/2017. FINDINGS: Lower chest: No acute abnormality. Hepatobiliary: No focal liver abnormality is seen. No gallstones, gallbladder wall thickening, or biliary dilatation. Pancreas: Unremarkable. No pancreatic ductal dilatation or surrounding inflammatory changes. Spleen: Normal in size without focal abnormality. Adrenals/Urinary Tract: Adrenal glands are unremarkable. Kidneys are normal, without renal calculi, focal lesion, or hydronephrosis. Bladder is unremarkable. Stomach/Bowel: Stomach within normal limits. No dilated small bowel. Interval appendectomy. Interim development of significant wall  thickening of the cecum and ascending colon with moderate residual inflammatory changes around the cecum and ascending colon. Small rim enhancing extraluminal fluid collections in the right lower quadrant measuring up to 18 mm. Appearance of calcified stones in the  right lower quadrant which appear extraluminal, series 2, image number 36 and series 2, image number 42. The more inferior stone appears to be associated with irregular rim enhancing fluid collection measuring up to 3.1 cm. Vascular/Lymphatic: Nonaneurysmal aorta. Multiple right lower quadrant mesenteric lymph nodes measuring up to 8 mm. Reproductive: Prostate is unremarkable. Other: Negative for free air. Small rim enhancing fluid collection in the pelvis measuring 3.4 by 1.6 cm. Musculoskeletal: No acute or significant osseous findings. IMPRESSION: 1. Interval appendectomy. Development of severe wall thickening of the cecum and ascending colon, consistent with colitis. There appear to be extraluminal stones in the right lower quadrant as described above which have similar appearance to the appendicoliths noted on the prior study. In addition, there are multiple small rim enhancing fluid collections surrounding the inflamed colon suspicious for abscess. Small 3.4 cm rim enhancing fluid collection in the pelvis also suspect for abscess. 2. Negative for bowel obstruction 3. Multiple right lower quadrant mesenteric lymph nodes, likely reactive. Electronically Signed   By: Donavan Foil M.D.   On: 12/01/2017 16:21    Pending Labs Unresulted Labs (From admission, onward)   Start     Ordered   12/01/17 1353  HIV antibody  Add-on,   R     12/01/17 1352      Vitals/Pain Today's Vitals   12/01/17 1743 12/01/17 1743 12/01/17 1810 12/01/17 2000  BP:  (!) 140/97  131/69  Pulse:  80  79  Resp:  18  18  Temp:      TempSrc:      SpO2:  99%  98%  Weight:      Height:      PainSc: 2   3      Isolation Precautions No active  isolations  Medications Medications  0.9 %  sodium chloride infusion ( Intravenous New Bag/Given 12/01/17 1407)  sodium chloride 0.9 % injection (has no administration in time range)  iopamidol (ISOVUE-300) 61 % injection (has no administration in time range)  dextrose 5% in lactated ringers with KCl 20 mEq/L infusion ( Intravenous Hold 12/01/17 1913)  ciprofloxacin (CIPRO) IVPB 400 mg (has no administration in time range)    And  metroNIDAZOLE (FLAGYL) IVPB 500 mg (has no administration in time range)  acetaminophen (TYLENOL) tablet 650 mg (has no administration in time range)    Or  acetaminophen (TYLENOL) suppository 650 mg (has no administration in time range)  ibuprofen (ADVIL,MOTRIN) tablet 600 mg (600 mg Oral Given 12/01/17 1811)  diphenhydrAMINE (BENADRYL) capsule 25 mg (has no administration in time range)    Or  diphenhydrAMINE (BENADRYL) injection 25 mg (has no administration in time range)  morphine 2 MG/ML injection 2 mg (has no administration in time range)  gentamicin (GARAMYCIN) 590 mg in dextrose 5 % 100 mL IVPB (0 mg Intravenous Hold 12/01/17 1914)  gentamicin (GARAMYCIN) 590 mg in dextrose 5 % 100 mL IVPB (has no administration in time range)  sodium chloride 0.9 % bolus 1,000 mL (0 mLs Intravenous Stopped 12/01/17 1638)  morphine 2 MG/ML injection 2 mg (2 mg Intravenous Given 12/01/17 1403)  iopamidol (ISOVUE-300) 61 % injection 100 mL (100 mLs Intravenous Contrast Given 12/01/17 1554)  iopamidol (ISOVUE-300) 61 % injection 30 mL (30 mLs Oral Contrast Given 12/01/17 1432)  ciprofloxacin (CIPRO) IVPB 400 mg (0 mg Intravenous Stopped 12/01/17 2016)  metroNIDAZOLE (FLAGYL) IVPB 500 mg (0 mg Intravenous Stopped 12/01/17 1806)    Mobility walks

## 2017-12-02 LAB — HIV ANTIBODY (ROUTINE TESTING W REFLEX): HIV Screen 4th Generation wRfx: NONREACTIVE

## 2017-12-02 LAB — GENTAMICIN LEVEL, RANDOM: GENTAMICIN RM: 2.1 ug/mL

## 2017-12-02 MED ORDER — PREMIER PROTEIN SHAKE: 11.0000 [oz_av] | ORAL | Status: DC

## 2017-12-02 MED ORDER — ENOXAPARIN SODIUM 40 MG/0.4ML ~~LOC~~ SOLN
40.0000 mg | SUBCUTANEOUS | Status: DC
  Filled 2017-12-02: qty 0.4

## 2017-12-02 NOTE — Progress Notes (Signed)
Initial Nutrition Assessment  DOCUMENTATION CODES:   Not applicable  INTERVENTION:  - Will order Premier Protein once/day, this supplement provides 160 kcal and 30 grams of protein.  - Diet advancement as medically feasible. - RD will continue to monitor for additional nutrition-related needs.   NUTRITION DIAGNOSIS:   Inadequate oral intake related to other (see comment)(current diet order) as evidenced by other (comment)(NPO advanced to FLD for lunch today).  GOAL:   Patient will meet greater than or equal to 90% of their needs  MONITOR:   PO intake, Supplement acceptance, Diet advancement, Weight trends, Labs  REASON FOR ASSESSMENT:   Malnutrition Screening Tool  ASSESSMENT:   27 year old male presenting status-post appendectomy. Emergent laparoscopic appendectomy was performed on 11/14/17 by Dr. Dwain SarnaWakefield.  Discharged post-op day 5 after his postop ileus resolved.  He also had numerous loose stools prior to discharge so C. difficile was obtained which was negative.  He presented to the ER the following day, on 11/20/17, for abdominal pain but left before the workup could be initiated.  He states he has right lower quadrant abdominal cramping and.  Umbilical pain occasionally.  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  He is taking Tylenol for pain relief.  Tolerating diet well and denies diarrhea and constipation.  Denies issues with urination.  Denies fever, nausea, or vomiting.    BMI indicates normal weight. Diet advanced from NPO to FLD today at 10:55 AM and pt received meal tray shortly before 12:00 PM. He states that shortly after surgery on 4/5 he began eating solid foods and was frequenting places like [employer], and [employer] for meals as he typically cooks but was told not to lift and since "since I was not allowed to lift the skillets it defeated the purpose of cooking." He states he was eating at these places knowing they were  unhealthy but wanting to gain weight quickly. He denies ever having abdominal pain, nausea/vomiting, or other abdominal discomfort with eating after surgery. Pt and male visitor at bedside express frustration with him being on FLD and not being able to have solid foods at this time. He reports very good appetite.   Pt reports that he was at his UBW of ~200 lbs prior to surgery on 4/5. Per review of chart, pt weighed 195 lbs on the day of surgery. Based on current weight, that indicates 9 lb weight loss (4.6% body weight) in 18 days; this is significant for time frame. Also noted high rate IVF. Unable to state malnutrition based on ASPEN guidelines at this time.   Medications reviewed. Labs reviewed.  IVF: NS @ 125 mL/hr starting at 1:45 PM on 4/22 and was changed to D5-LR-20 mEq KCl @ 100 mL/hr (408 kcal) at 6:00 PM on 4/22.      NUTRITION - FOCUSED PHYSICAL EXAM:  Completed/assessed with no muscle and no fat wasting noted at this time.   Diet Order:  Diet full liquid Room service appropriate? Yes; Fluid consistency: Thin  EDUCATION NEEDS:   No education needs have been identified at this time  Skin:  Skin Assessment: Skin Integrity Issues: Skin Integrity Issues:: Incisions Incisions: abdominal from lap appy on 4/5  Last BM:  4/22  Height:   Ht Readings from Last 1 Encounters:  12/01/17 6\' 1"  (1.854 m)    Weight:   Wt Readings from Last 1 Encounters:  12/02/17 187 lb 4.8 oz (85 kg)  Ideal Body Weight:  83.64 kg  BMI:  Body mass index is 24.71 kg/m.  Estimated Nutritional Needs:   Kcal:  2125-2380 (25-28 kcal/kg)  Protein:  105-120 grams (1.2-1.4 grams/kg)  Fluid:  >/= 2.1 L/day      Trenton Gammon, MS, RD, LDN, [facility] Inpatient Clinical Dietitian Pager # [phone] After hours/weekend pager # [phone]

## 2017-12-02 NOTE — Progress Notes (Signed)
Pharmacy Antibiotic Note  Samuel Herrera is a 27 y.o. male with recent gangrenous perforated appendicitis - s/p appendectomy on 11/14/17, presented to the ED on 12/01/2017 with for further workup of leukocytosis. Abd CTA on 4/22 showed findings consistent with colitis and abscesses.  Gentamycin, ciprofloxacin and flagyl were started for intra-abdominal infection.  Today, 12/02/2017: - day #2 abx - Afeb, wbc 11.7 on 4/22 - scr 0.81 (crcl>100) - 10 hr gentamicin level = 2.1 (11 hrs post-dose) -- therapeutic  Plan: - continue gentamicin 590 mg IV q24h - continue cipro 400 mg IV q12h and flagyl 500 mg IV q8h per MD - monitor renal function closely  - Consider d/c NSAID (motrin) to avoid nephrotoxicity while on gentamicin __________________________________  Height: 6\' 1"  (185.4 cm) Weight: 186 lb (84.4 kg) IBW/kg (Calculated) : 79.9  Temp (24hrs), Avg:98.4 F (36.9 C), Min:97.9 F (36.6 C), Max:98.8 F (37.1 C)  Recent Labs  Lab 12/01/17 1254 12/01/17 1300  WBC 11.7*  --   CREATININE 0.81  --   LATICACIDVEN  --  0.85    Estimated Creatinine Clearance: 156.2 mL/min (by C-G formula based on SCr of 0.81 mg/dL).    Allergies  Allergen Reactions  . Amoxicillin Hives  . Penicillins Hives   Antimicrobials this admission:  4/22 gent >> 4/22 cipro/flagyl per MD >>  Dose adjustments this admission:  4/23 gent 10 hr level= 2.1 (11 hrs post dose) -- cont 590 mg q24h  Microbiology results:  4/5 bcx x2: neg FINAL 4/10 cdiff pcr: neg 4/22: HIV antb: neg   Thank you for allowing pharmacy to be a part of this patient's care.  Lucia Gaskinsham, Kenishia Plack P 12/02/2017 8:08 AM

## 2017-12-02 NOTE — Progress Notes (Signed)
Patient ID: Samuel Herrera, male   DOB: 12-07-1990, 27 y.o.   MRN: [id number] Request received for possible CT-guided drainage of abdominal fluid collection on patient.  Imaging studies were reviewed by Dr. Fredia SorrowYamagata.  Per Dr. Fredia SorrowYamagata the small collection posterior to the bladder is only 2.3 cm by his measurement and high risk to approach for percutaneous drainage.  Please contact Dr. Fredia SorrowYamagata at [phone] with any additional questions.

## 2017-12-02 NOTE — Progress Notes (Addendum)
    CC:  Abdominal pain Subjective: He is doing well this AM. No increased pain and he slept fairly well last PM    Objective: Vital signs in last 24 hours: Temp:  [97.9 F (36.6 C)-98.8 F (37.1 C)] 98.4 F (36.9 C) (04/23 0412) Pulse Rate:  [68-89] 75 (04/23 0412) Resp:  [17-18] 17 (04/23 0412) BP: (110-140)/(59-97) 111/61 (04/23 0412) SpO2:  [98 %-100 %] 98 % (04/23 0412) Weight:  [84.4 kg (186 lb)] 84.4 kg (186 lb) (04/22 0935) Last BM Date: 12/01/17 240 PO Urine x 2 2300 IV Afebrile, VSS No labs CT 4/22:  Severe wall thickening of cecum/ascending colon, - colitis       Extra luminal stones/multiple rim enhancing fluid collection - 3.4 cm site in pelvis   Intake/Output from previous day: 04/22 0701 - 04/23 0700 In: 3415.4 [P.O.:240; I.V.:1775.4; IV Piggyback:1400] Out: -  Intake/Output this shift: No intake/output data recorded.  General appearance: alert, cooperative and no distress Resp: clear to auscultation bilaterally GI: soft, minimal tenderness, + BS, sites all look fine.  Lab Results:  Recent Labs    12/01/17 1254  WBC 11.7*  HGB 13.9  HCT 41.6  PLT 652*    BMET Recent Labs    12/01/17 1254  NA 142  K 4.3  CL 105  CO2 26  GLUCOSE 92  BUN 17  CREATININE 0.81  CALCIUM 9.5   PT/INR No results for input(s): LABPROT, INR in the last 72 hours.  Recent Labs  Lab 12/01/17 1254  AST 45*  ALT 109*  ALKPHOS 117  BILITOT 0.5  PROT 8.3*  ALBUMIN 3.6     Lipase     Component Value Date/Time   LIPASE 33 11/20/2017 0455     Medications:   . sodium chloride Stopped (12/01/17 2200)  . ciprofloxacin Stopped (12/02/17 [id number])   And  . metronidazole Stopped (12/02/17 0944)  . dextrose 5% lactated ringers with KCl 20 mEq/L 100 mL/hr at 12/01/17 2206  . gentamicin      Assessment/Plan S/p laparoscopic appendectomy for gangrenous perforated  Appendicitis 11/14/17 Post op ileus/diarrhea - C diff colitis negative Hx of ETOH/MJ use HIV -  negative  Post op colitis, possible pelvic abscess, possible retained appendicoliths  - IR evaluation 4/23- no amenable to IR drainage   FEN: full liquids  ID:  Restart Cipro/Flagyl/gentamycin - protocol - 12/01/17 =>> day 2 DVT:  SCD - hold anticoagulation till seen by IR   Plan:  IR evaluation today, continue antibiotics.  Fluid collections not amenable to IR drainage, continue antibiotics and add Lovenox for DVT prophylaxis.        LOS: 0 days    Samuel Herrera 12/02/2017 [phone]

## 2017-12-03 LAB — CBC
HCT: 37.9 % — ABNORMAL LOW (ref 39.0–52.0)
Hemoglobin: 12.6 g/dL — ABNORMAL LOW (ref 13.0–17.0)
MCH: 29.6 pg (ref 26.0–34.0)
MCHC: 33.2 g/dL (ref 30.0–36.0)
MCV: 89 fL (ref 78.0–100.0)
Platelets: 518 10*3/uL — ABNORMAL HIGH (ref 150–400)
RBC: 4.26 MIL/uL (ref 4.22–5.81)
RDW: 12.5 % (ref 11.5–15.5)
WBC: 7.6 10*3/uL (ref 4.0–10.5)

## 2017-12-03 LAB — BASIC METABOLIC PANEL
Anion gap: 9 (ref 5–15)
BUN: 12 mg/dL (ref 6–20)
CO2: 29 mmol/L (ref 22–32)
Calcium: 9.4 mg/dL (ref 8.9–10.3)
Chloride: 101 mmol/L (ref 101–111)
Creatinine, Ser: 0.99 mg/dL (ref 0.61–1.24)
GFR calc non Af Amer: >60 mL/min (ref >60)
Glucose, Bld: 98 mg/dL (ref 65–99)
POTASSIUM: 4.5 mmol/L (ref 3.5–5.1)
SODIUM: 139 mmol/L (ref 135–145)

## 2017-12-03 MED ORDER — METRONIDAZOLE 500 MG PO TABS
500.0000 mg | ORAL_TABLET | Freq: Three times a day (TID) | ORAL | Status: DC
  Administered 2017-12-03: 500 mg via ORAL
  Filled 2017-12-03: qty 1

## 2017-12-03 MED ORDER — CIPROFLOXACIN HCL 500 MG PO TABS
500.0000 mg | ORAL_TABLET | Freq: Two times a day (BID) | ORAL | Status: DC
  Filled 2017-12-03: qty 1

## 2017-12-03 MED ORDER — SACCHAROMYCES BOULARDII 250 MG PO CAPS
250.0000 mg | ORAL_CAPSULE | Freq: Two times a day (BID) | ORAL | Status: DC
  Administered 2017-12-03: 250 mg via ORAL
  Filled 2017-12-03: qty 1

## 2017-12-03 MED ORDER — SACCHAROMYCES BOULARDII 250 MG PO CAPS: ORAL_CAPSULE | ORAL | Status: DC

## 2017-12-03 MED ORDER — CIPROFLOXACIN HCL 500 MG PO TABS: 500.0000 mg | ORAL_TABLET | Freq: Two times a day (BID) | ORAL | 0 refills | Status: AC

## 2017-12-03 MED ORDER — METRONIDAZOLE 500 MG PO TABS: 500.0000 mg | ORAL_TABLET | Freq: Three times a day (TID) | ORAL | 0 refills | Status: AC

## 2017-12-03 NOTE — Progress Notes (Signed)
Discharge instructions reviewed with patient. All questions answered. Patient ambulated to vehicle with belongings by nurse tech 

## 2017-12-03 NOTE — Discharge Instructions (Signed)
[facility]: POST OP INSTRUCTIONS  If you have fever, 100 or greater, increased pain, nausea, trouble voiding, or just feel bad, call and let us see you before your follow up appointment.   Always review your discharge instruction sheet given to you by the facility where your surgery was performed. IF YOU HAVE DISABILITY OR FAMILY LEAVE FORMS, YOU MUST BRING THEM TO THE OFFICE FOR PROCESSING.   DO NOT GIVE THEM TO YOUR DOCTOR.  1. A prescription for pain medication may be given to you upon discharge.  Take your pain medication as prescribed, if needed.  If narcotic pain medicine is not needed, then you may take acetaminophen (Tylenol) or ibuprofen (Advil) as needed. 2. Take your usually prescribed medications unless otherwise directed. 3. If you need a refill on your pain medication, please contact your pharmacy.  They will contact our office to request authorization. Prescriptions will not be filled after 5pm or on week-ends. 4. You should follow a light diet the first few days after arrival home, such as soup and crackers, etc.  Be sure to include lots of fluids daily. 5. Most patients will experience some swelling and bruising in the area of the incisions.  Ice packs will help.  Swelling and bruising can take several days to resolve.  6. It is common to experience some constipation if taking pain medication after surgery.  Increasing fluid intake and taking a stool softener (such as Colace) will usually help or prevent this problem from occurring.  A mild laxative (Milk of Magnesia or Miralax) should be taken according to package instructions if there are no bowel movements after 48 hours. 7. Unless discharge instructions indicate otherwise, you may remove your bandages 24-48 hours after surgery, and you may shower at that time.  You may have steri-strips (small skin tapes) in place directly over the incision.  These strips should be left on the  skin for 7-10 days.  If your surgeon used skin glue on the incision, you may shower in 24 hours.  The glue will flake off over the next 2-3 weeks.  Any sutures or staples will be removed at the office during your follow-up visit. 8. ACTIVITIES:  You may resume regular (light) daily activities beginning the next day--such as daily self-care, walking, climbing stairs--gradually increasing activities as tolerated.  You may have sexual intercourse when it is comfortable.  Refrain from any heavy lifting or straining until approved by your doctor. a. You may drive when you are no longer taking prescription pain medication, you can comfortably wear a seatbelt, and you can safely maneuver your car and apply brakes. b. RETURN TO WORK:  __________________________________________________________ 9. You should see your doctor in the office for a follow-up appointment approximately 2-3 weeks after your surgery.  Make sure that you call for this appointment within a day or two after you arrive home to insure a convenient appointment time. 10. OTHER INSTRUCTIONS: __________________________________________________________________________________________________________________________ __________________________________________________________________________________________________________________________ WHEN TO CALL YOUR DOCTOR: 1. Fever over 101.0 2. Inability to urinate 3. Continued bleeding from incision. 4. Increased pain, redness, or drainage from the incision. 5. Increasing abdominal pain  The clinic staff is available to answer your questions during regular business hours.  Please dont hesitate to call and ask to speak to one of the nurses for clinical concerns.  If you have a medical emergency, go to the nearest emergency room or call 911.  A surgeon from [facility] is always on call at the  hospital. [address], [address], [place]  [address] ? [address] [address], [place], [place]    [id number] [phone] ? [phone] ? FAX [phone]) [phone] Web site: [email or web address]

## 2017-12-03 NOTE — Progress Notes (Signed)
    JY:[id number]:abdominal pain  Subjective: He is still a bit sore and has some positional pain.  He is feeling better but still having some discomfort.  He seems to expect it.  He works as a Production assistant, radioserver at [employer].  He want to go home but his mom is fairly anxious about it.  No pain with regular diet or BM.  Objective: Vital signs in last 24 hours: Temp:  [97.7 F (36.5 C)-99.4 F (37.4 C)] 98.9 F (37.2 C) (04/24 0335) Pulse Rate:  [67-92] 67 (04/24 0335) Resp:  [16-18] 16 (04/24 0335) BP: (103-124)/(69-80) 114/70 (04/24 0335) SpO2:  [97 %-100 %] 98 % (04/24 0335) Weight:  [85 kg (187 lb 4.8 oz)] 85 kg (187 lb 4.8 oz) (04/23 1121) Last BM Date: 12/02/17  490 Po 3200 IV Urine x 4 Afebrile, VSS WBC 7.6 BMP OK + normal BM    Intake/Output from previous day: 04/23 0701 - 04/24 0700 In: 3704.8 [P.O.:490; I.V.:2400; IV Piggyback:814.8] Out: -  Intake/Output this shift: No intake/output data recorded.  General appearance: alert, cooperative and no distress Resp: clear to auscultation bilaterally GI: soft, non-tender; bowel sounds normal; no masses,  no organomegaly and he has some positional discomfort, but no tenderness on exam.    Lab Results:  Recent Labs    12/01/17 1254 12/03/17 0729  WBC 11.7* 7.6  HGB 13.9 12.6*  HCT 41.6 37.9*  PLT 652* 518*    BMET Recent Labs    12/01/17 1254 12/03/17 0729  NA 142 139  K 4.3 4.5  CL 105 101  CO2 26 29  GLUCOSE 92 98  BUN 17 12  CREATININE 0.81 0.99  CALCIUM 9.5 9.4   PT/INR No results for input(s): LABPROT, INR in the last 72 hours.  Recent Labs  Lab 12/01/17 1254  AST 45*  ALT 109*  ALKPHOS 117  BILITOT 0.5  PROT 8.3*  ALBUMIN 3.6     Lipase     Component Value Date/Time   LIPASE 33 11/20/2017 0455     Medications: . enoxaparin (LOVENOX) injection  40 mg Subcutaneous Q24H  . protein supplement shake  11 oz Oral Q24H    Assessment/Plan S/p laparoscopic appendectomy for gangrenous  perforated Appendicitis 11/14/17 Post op ileus/diarrhea - C diff colitis negative Hx of ETOH/MJ use HIV - negative  Post op colitis, possible pelvic abscess, possible retained appendicoliths  - IR evaluation 4/23- no amenable to IR drainage   FEN: regular diet ID: Restart Cipro/Flagyl/gentamycin - protocol - 12/01/17 =>> day 3 - switch to PO Cipro/Flagyl DVT:SCD - Lovenox   Plan:  I will let Dr. Daphine DeutscherMartin see him and make a final decision.  I think we can send him home on ABX and follow up in the office. His last CT was 12/01/17.       LOS: 0 days    Dex Blakely 12/03/2017 [phone]

## 2017-12-03 NOTE — Discharge Summary (Addendum)
Physician Discharge Summary  Patient ID: Samuel Herrera MRN: [id number] DOB/AGE: 16-Jun-1991 27 y.o.  Admit date: 12/01/2017 Discharge date: 12/03/2017  Admission Diagnoses:  Post op colitis, possible pelvic abscess, possible retained appendicoliths S/p laparoscopic appendectomy for gangrenous perforated  Appendicitis 11/14/17 Post op ileus/diarrhea - C diff colitis negative   Discharge Diagnoses:  Same   Active Problems:   Postprocedural intraabdominal abscess   PROCEDURES: none   Hospital Course:  Patient is a 27 year old male who was admitted on 11/14/17 by Dr. Emelia LoronMatthew Wakefield with appendicitis.  He was taken the operating room and was found to have a gangrenous perforated appendicitis.The appendix was stuck to the retroperitoneum and perforated in the proximal third.  The appendix was gangrenous.  He underwent appendectomy and the appendix was removed through the umbilicus.  He had a postoperative ileus, and was treated with Cipro and Flagyl through 11/19/17 at which time he was discharged home on Tylenol and ibuprofen.  He has some loose stools prior to discharge and was checked for C. difficile which was also negative prior to discharge. He was seen in the ED again on 11/20/2017 for abdominal pain but left prior to evaluation. He return for routine postoperative check on 11/27/17 and had some tenderness of WBC was found to be elevated.  He was sent to the ED today for further evaluation.  His discomfort is much better.  He is tolerating a diet.  Continues to have some abdominal discomfort.  Workup in the ED today shows his white count is better but still elevated 11.7, hemoglobin 13.9, hematocrit 41.6, platelets 652,000. AST slightly elevated at 45, ALT 109.  Total bilirubin 0.5.  CT scan shows interval appendectomy with development of severe wall thickening of the cecum and ascending colon consistent with colitis.  There appears to be extraluminal stones in the right lower quadrant  similar in appearance to appendicoliths noted on prior study.  There are multiple small rim-enhancing fluid collections surrounding the inflamed colon suspicious for abscess.  Is also a 3.4 cm rim-enhancing fluid collection in the pelvis which is suspect for abscess.  Patient also reports he had a sexual encounter over a year ago, and is subsequently found out this person has HIV and requested testing for HIV.   Pt was admitted and place back on Cipro/Flagyl IV.  IR consult was obtained, and sites were not amenable to IR drainage.  On IV antibiotics his symptoms improved, his WBC also improved.  He still has some positional discomfort, but his abdominal exam is normal. Port sites also look good.    It was Dr. Ermalene SearingMartin's opinion he could go home on oral antibiotics, Cipro and Flagyl were continued because of his Penicillin allergy.  He was instructed multiple times to call if he has any new or reoccurring symptoms.  We would see him sooner in the office or the ED depending on his symptoms.  Follow up is arranged for 12/17/17 with Dr. Dwain SarnaWakefield, and CT on the day prior to his follow up appointment is being scheduled.  The office will call with his CT arrangements when they are completed.    CBC Latest Ref Rng & Units 12/03/2017 12/01/2017 11/20/2017  WBC 4.0 - 10.5 K/uL 7.6 11.7(H) 11.2(H)  Hemoglobin 13.0 - 17.0 g/dL 12.6(L) 13.9 12.2(L)  Hematocrit 39.0 - 52.0 % 37.9(L) 41.6 35.3(L)  Platelets 150 - 400 K/uL 518(H) 652(H) 445(H)   CMP Latest Ref Rng & Units 12/03/2017 12/01/2017 11/20/2017  Glucose 65 - 99 mg/dL 98 92  103(H)  BUN 6 - 20 mg/dL 12 17 10   Creatinine 0.61 - 1.24 mg/dL 8.11 9.14 7.82  Sodium 135 - 145 mmol/L 139 142 139  Potassium 3.5 - 5.1 mmol/L 4.5 4.3 3.4(L)  Chloride 101 - 111 mmol/L 101 105 105  CO2 22 - 32 mmol/L 29 26 24   Calcium 8.9 - 10.3 mg/dL 9.4 9.5 9.5(A)  Total Protein 6.5 - 8.1 g/dL - 8.3(H) 6.4(L)  Total Bilirubin 0.3 - 1.2 mg/dL - 0.5 0.3  Alkaline Phos 38 - 126 U/L - 117  55  AST 15 - 41 U/L - 45(H) 43(H)  ALT 17 - 63 U/L - 109(H) 22   HIV was check per pt request and it was negative.    Condition on discharge:  Improving   Disposition: Discharge disposition: 01-Home or Self Care        Allergies as of 12/03/2017      Reactions   Amoxicillin Hives   Penicillins Hives      Medication List    TAKE these medications   acetaminophen 325 MG tablet Commonly known as:  TYLENOL Take 2 tablets (650 mg total) by mouth every 6 (six) hours as needed for mild pain or fever.   ciprofloxacin 500 MG tablet Commonly known as:  CIPRO Take 1 tablet (500 mg total) by mouth 2 (two) times daily for 10 days.   ibuprofen 400 MG tablet Commonly known as:  ADVIL,MOTRIN Take 1-2 tablets (400-800 mg total) by mouth every 8 (eight) hours as needed for headache, mild pain or moderate pain.   metroNIDAZOLE 500 MG tablet Commonly known as:  FLAGYL Take 1 tablet (500 mg total) by mouth every 8 (eight) hours for 10 days.   saccharomyces boulardii 250 MG capsule Commonly known as:  FLORASTOR You can buy this at any drug store over the counter, take it for the next month.  Follow package instructions.      Follow-up Information    Emelia Loron, MD Follow up on 12/17/2017.   Specialty:  General Surgery Why:  Your appointment is at 11 AM.  Be at the office 30 minutes early for check in. If you get sick call and we will see you sooner.  Our office will call and is setting you up for a CT scan on 5/6, or 12/16/17 before you come back to see Dr. Jeananne Rama information: [address] [place] [address] [phone]        Jerl Mina, MD Follow up.   Specialty:  Family Medicine Why:  Follow up for your other medical issues Contact information: [address] [facility] [place] [address] [phone]           Signed: Sherrie George 12/03/2017, 10:58 AM

## 2017-12-04 ENCOUNTER — Other Ambulatory Visit: Payer: Self-pay | Admitting: General Surgery

## 2017-12-04 DIAGNOSIS — T8149XA Infection following a procedure, other surgical site, initial encounter: Secondary | ICD-10-CM

## 2017-12-08 NOTE — Discharge Summary (Signed)
[facility] Discharge Summary   Patient ID: DAYLEN LIPSKY MRN: [id number] DOB/AGE: Mar 14, 1991 27 y.o.  Admit date: 11/14/2017 Discharge date: 11/19/2017  Discharge Diagnosis Patient Active Problem List   Diagnosis Date Noted  . Postprocedural intraabdominal abscess 12/01/2017  . Perforated appendicitis 11/14/2017  . Appendicitis 11/14/2017  . Fracture closed, humerus, shaft 10/03/2015    Consultants N/A  Imaging: CT ABD PELV 11/14/17 -  Findings consistent with acute appendicitis with 3 central appendicoliths identified. Periappendiceal inflammatory changes are noted although no discrete collection is noted. No evidence of perforation is seen.  Procedures Dr. Jannette Spanner wakefield (11/14/17) - Laparoscopic Appendectomy  Hospital Course:  27 y/o M who presented to [facility] with 48 h abdominal pain that localized to his RLQ. Workup showed acute appendicitis (CT scan above).  Patient was admitted and underwent procedure listed above where his appendix was noted to be gangrenous and perforated. Post-operatively the patient had an ileus which did resolve. His diet was advanced as tolerated Tolerated procedure well and was transferred to the floor.  Diet was advanced as tolerated.  On POD#5, the patient was voiding well, having bowel movements, tolerating diet, ambulating well, pain well controlled, vital signs stable, incisions c/d/i and felt stable for discharge home.  Patient will follow up in our office in 2 weeks and knows to call with questions or concerns.    Allergies as of 11/19/2017      Reactions   Amoxicillin Hives   Penicillins Hives      Medication List    STOP taking these medications   oxyCODONE 5 MG immediate release tablet Commonly known as:  Oxy IR/ROXICODONE     TAKE these medications   acetaminophen 325 MG tablet Commonly known as:  TYLENOL Take 2 tablets (650 mg total) by mouth every 6 (six) hours as needed for mild pain or fever.   ibuprofen 400 MG  tablet Commonly known as:  ADVIL,MOTRIN Take 1-2 tablets (400-800 mg total) by mouth every 8 (eight) hours as needed for headache, mild pain or moderate pain.        Follow-up Information    [facility], [place]. Call.   Specialty:  General Surgery Why:  We are working on your appointment, please call to confirm. Please arrive 30 minutes prior to your appointment to check in and fill out paperwork. Bring photo ID and insurance information. Contact information: [address] [place] [address] [phone]          Signed: Hosie Spangle, [facility] 12/08/2017, 1:53 PM Pager: [phone] Consults: [phone] Mon-Fri 7:00 am-4:30 pm Sat-Sun 7:00 am-11:30 am

## 2017-12-15 ENCOUNTER — Ambulatory Visit
Admission: RE | Admit: 2017-12-15 | Discharge: 2017-12-15 | Disposition: A | Discharge status: Home / Self Care | Payer: 59 | Source: Ambulatory Visit | Attending: General Surgery | Admitting: General Surgery

## 2017-12-15 DIAGNOSIS — T8149XA Infection following a procedure, other surgical site, initial encounter: Secondary | ICD-10-CM

## 2017-12-15 MED ORDER — IOPAMIDOL (ISOVUE-300) INJECTION 61%
100.0000 mL | Freq: Once | INTRAVENOUS | Status: AC | PRN
  Administered 2017-12-15: 100 mL via INTRAVENOUS

## 2018-08-23 IMAGING — CT CT ABD-PELV W/ CM
2 of 4 series · 13 of 46 positions shown, 15 images · IV contrast (iopamidol)
Comparison: CT abdomen pelvis of 12/01/2017

CLINICAL DATA: Postop laparoscopic appendectomy with small abscess,
follow-up, some right lower quadrant pain

EXAM:
CT ABDOMEN AND PELVIS WITH CONTRAST
TECHNIQUE: Multidetector CT imaging of the abdomen and pelvis was performed
using the standard protocol following bolus administration of
intravenous contrast.
CONTRAST:  100mL JVNX9I-SLL IOPAMIDOL (JVNX9I-SLL) INJECTION 61%

[Series 2: abd pelvis 5.00 br40 s3 ax · axial · 0.60mm/px · z∈[+1033,+1442]mm · 10 of 98 slices shown, 12 images]
[im 8/98  soft-tissue]
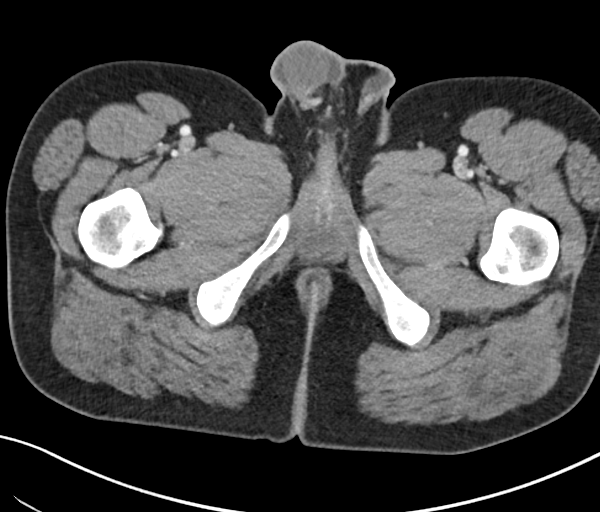
[im 8/98  bone]
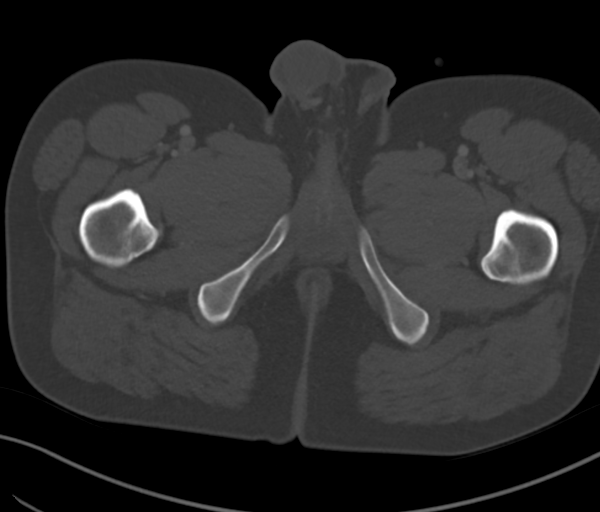
[im 16/98  soft-tissue]
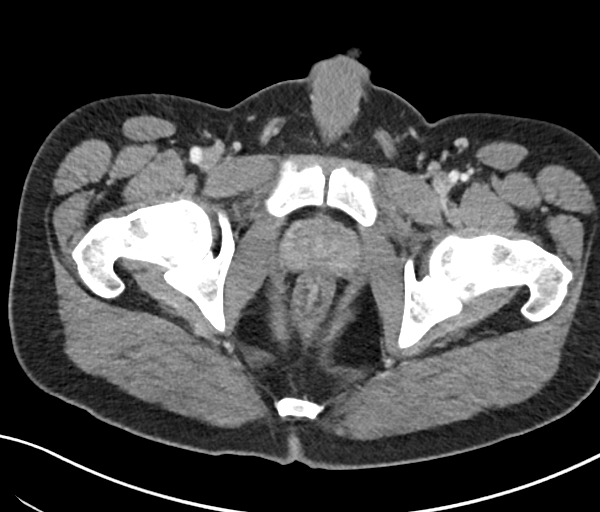
[im 28/98  soft-tissue]
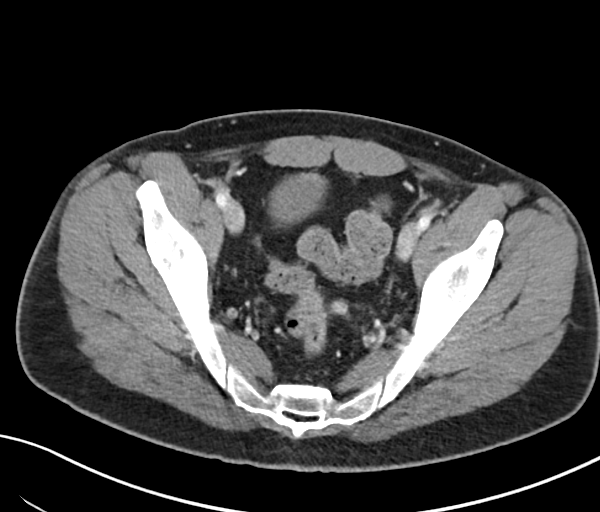
[im 35/98  soft-tissue]
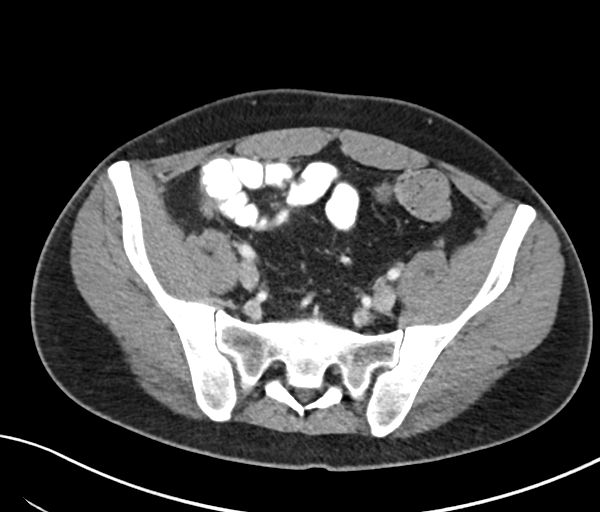
[im 43/98  soft-tissue]
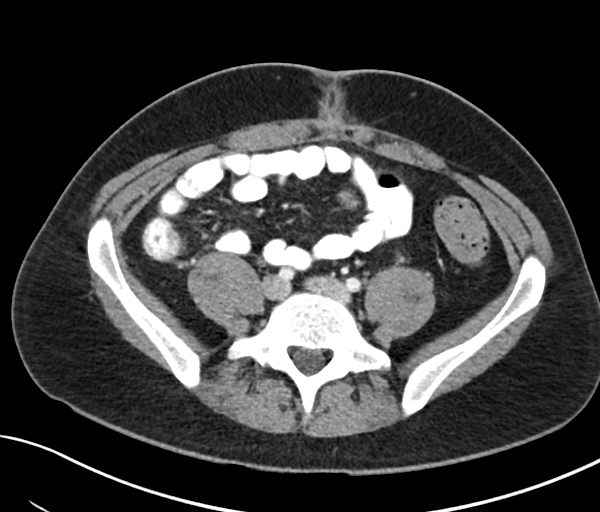
[im 55/98  soft-tissue]
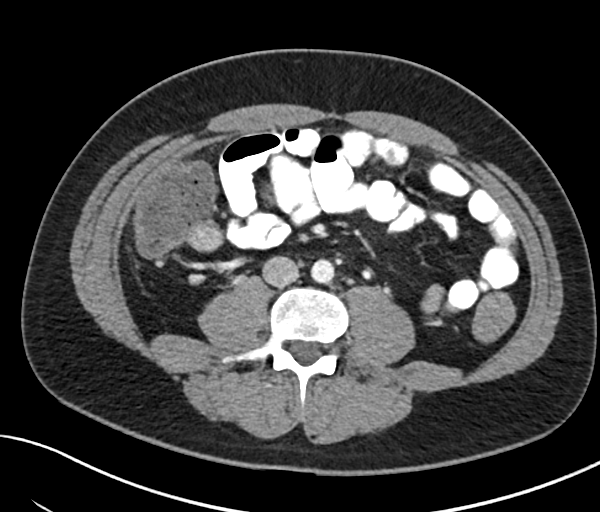
[im 63/98  soft-tissue]
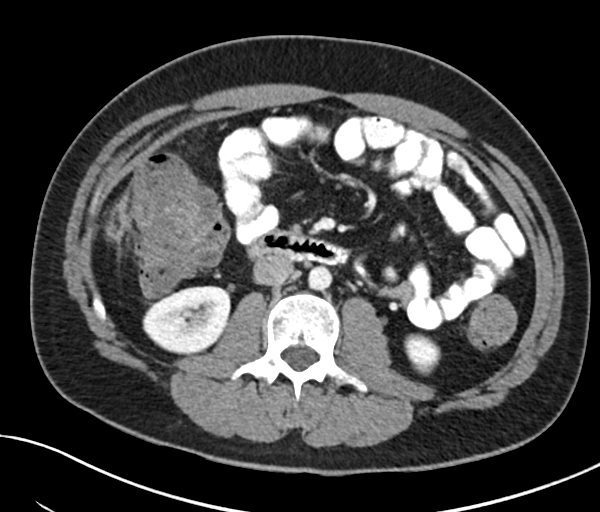
[im 74/98  soft-tissue]
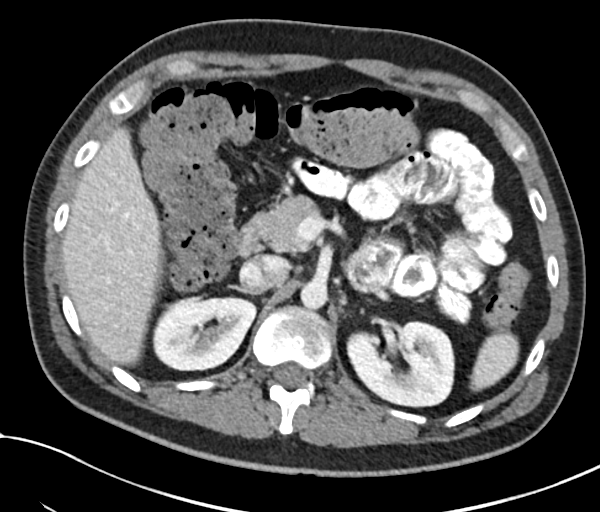
[im 82/98  soft-tissue]
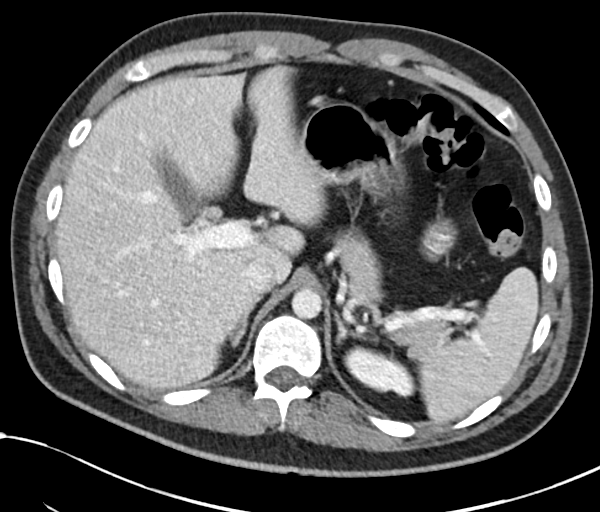
[im 82/98  bone]
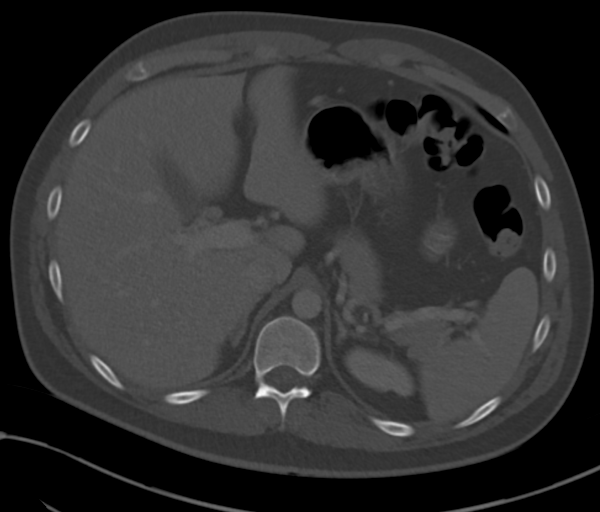
[im 90/98  soft-tissue]
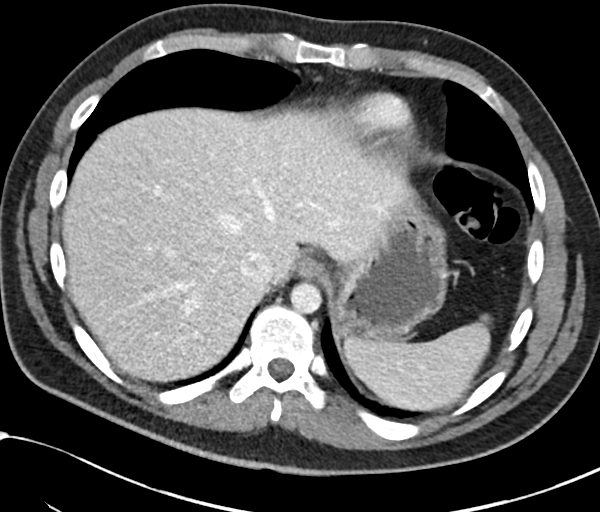

[Series 6: abd pelvis 2.00 br40 s3 cor · coronal · 0.71mm/px · 3 of 141 slices shown]
[im 47/141  soft-tissue]
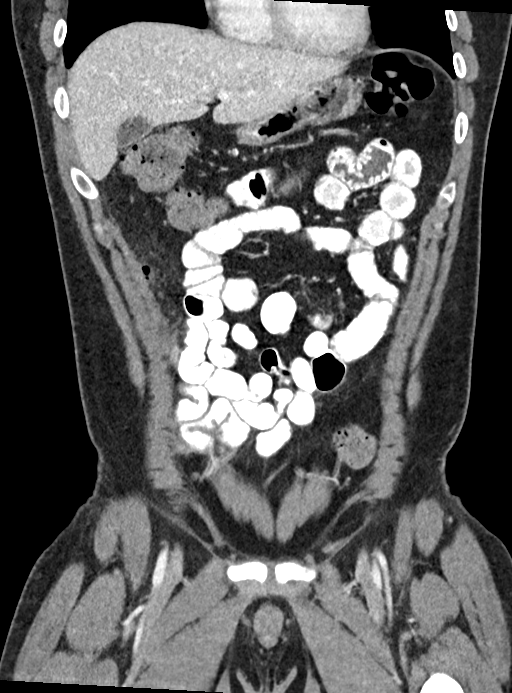
[im 63/141  soft-tissue]
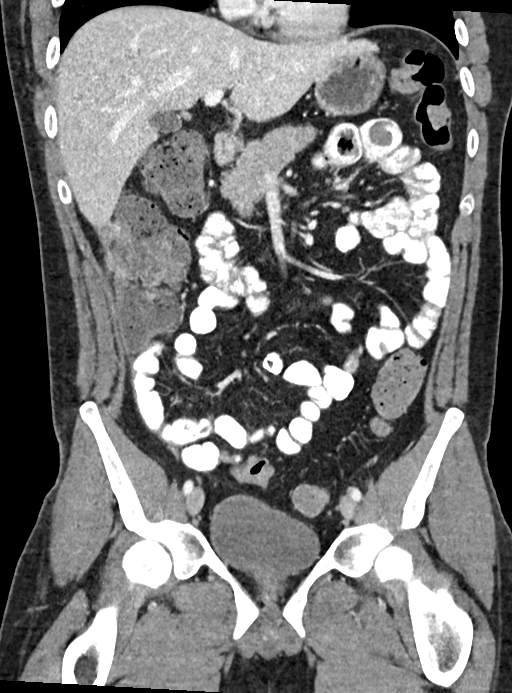
[im 78/141  soft-tissue]
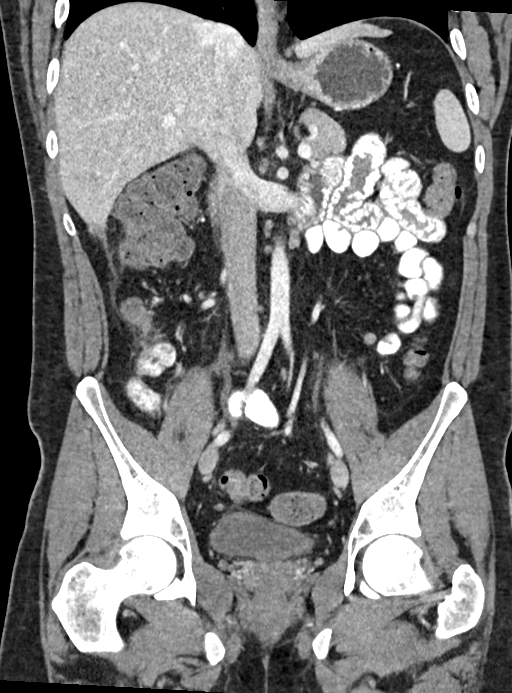

[13 of 46 positions shown; findings below may reference images not displayed]

FINDINGS: Lower chest: The lung bases remain clear. No pleural effusion is
seen.

Hepatobiliary: The liver enhances with no focal abnormality and no
ductal dilatation is seen. There is a probable small node within the
fat anterior to the liver on image 15 series 2 that is unchanged in
size in the interval. No calcified gallstones are seen.

Pancreas: The pancreas is normal in size in the pancreatic duct is
not dilated.

Spleen: The spleen is unremarkable.

Adrenals/Urinary Tract: The adrenal glands appear normal. The
kidneys enhance with no calculus or mass and no hydronephrosis is
seen. The ureters are normal in caliber to the bladder. The urinary
bladder is not well distended but no abnormality is noted.

Stomach/Bowel: The stomach is moderately filled with fluid with no
abnormality noted. There are a few slightly prominent loops of small
bowel in the left abdomen which may be due to mild ileus but no
obstruction is seen with certainty. The terminal ileum is
unremarkable. The appendix has been surgically resected. No abscess
is seen at the site of resection. Minimal strandiness is present
postoperatively at that site. The edema at the base of the cecum has
improved.

Vascular/Lymphatic: The abdominal aorta is normal in caliber. No
adenopathy is seen. Small nodes are present within the right lower
quadrant none of which is larger than 7 mm in short axis diameter.

Reproductive: The prostate is normal in size.

Other: At the site of the probable stone within the right lower
quadrant just caudal to the right lobe of liver, there is slightly
more fluid present and this may represent a small abscess just
distal to the tip of the liver. Also, within the pelvis, there is an
enhancing oval structure present between the colon and urinary
bladder. This has decreased in size in the interval measuring 15 x
32 mm compared to 16 x 34 mm previously with a small central
low-attenuation collection of 6 mm. This probably represents an
improving abscess cavity.

Musculoskeletal: The lumbar vertebrae are in normal alignment with
normal intervertebral disc spaces. The SI joints appear corticated.
IMPRESSION: 1. Small abscess cavity remains at the tip of the right lobe of
liver which also contains a calculus as described previously.
2. The small mid pelvic abscess noted previously has diminished in
size. No new abscess cavity is seen.

## 2019-09-21 NOTE — Telephone Encounter (Signed)
Error

## 2019-10-29 ENCOUNTER — Ambulatory Visit: Payer: 59 | Attending: Internal Medicine

## 2019-11-06 ENCOUNTER — Ambulatory Visit: Payer: 59 | Attending: Internal Medicine

## 2019-11-06 DIAGNOSIS — Z23 Encounter for immunization: Secondary | ICD-10-CM

## 2019-11-06 NOTE — Progress Notes (Signed)
   Covid-19 Vaccination Clinic  Name:  Samuel Herrera    MRN: [id number] DOB: 1990/12/05  11/06/2019  Mr. Samuel Herrera was observed post Covid-19 immunization for 15 minutes without incident. He was provided with Vaccine Information Sheet and instruction to access the V-Safe system.   Samuel Herrera was instructed to call 911 with any severe reactions post vaccine: Marland Kitchen Difficulty breathing  . Swelling of face and throat  . A fast heartbeat  . A bad rash all over body  . Dizziness and weakness   Immunizations Administered    Name Date Dose VIS Date Route   Pfizer COVID-19 Vaccine 11/06/2019  9:21 AM 0.3 mL 07/23/2019 Intramuscular   Manufacturer: [employer], [employer]   Lot: [id number]   NDC: [address]-6

## 2019-12-01 ENCOUNTER — Ambulatory Visit: Payer: 59 | Attending: Internal Medicine

## 2019-12-01 DIAGNOSIS — Z23 Encounter for immunization: Secondary | ICD-10-CM

## 2019-12-01 NOTE — Progress Notes (Signed)
   Covid-19 Vaccination Clinic  Name:  Samuel Herrera    MRN: [id number] DOB: 03-21-1991  12/01/2019  Samuel Herrera was observed post Covid-19 immunization for 15 minutes without incident. He was provided with Vaccine Information Sheet and instruction to access the V-Safe system.   Samuel Herrera was instructed to call 911 with any severe reactions post vaccine: Marland Kitchen Difficulty breathing  . Swelling of face and throat  . A fast heartbeat  . A bad rash all over body  . Dizziness and weakness   Immunizations Administered    Name Date Dose VIS Date Route   Pfizer COVID-19 Vaccine 12/01/2019  8:26 AM 0.3 mL 10/06/2018 Intramuscular   Manufacturer: [employer], [employer]   Lot: [id number]   NDC: [address]-4

## 2021-03-19 DIAGNOSIS — L255 Unspecified contact dermatitis due to plants, except food: Secondary | ICD-10-CM | POA: Diagnosis not present

## 2021-03-23 DIAGNOSIS — L239 Allergic contact dermatitis, unspecified cause: Secondary | ICD-10-CM | POA: Diagnosis not present

## 2021-11-26 ENCOUNTER — Encounter: Payer: Self-pay | Admitting: Family Medicine

## 2021-11-26 ENCOUNTER — Ambulatory Visit (INDEPENDENT_AMBULATORY_CARE_PROVIDER_SITE_OTHER): Payer: BC Managed Care – PPO | Admitting: Family Medicine

## 2021-11-26 VITALS — BP 140/76 | HR 74 | Temp 97.0°F | Ht 73.0 in | Wt 204.6 lb

## 2021-11-26 DIAGNOSIS — F419 Anxiety disorder, unspecified: Secondary | ICD-10-CM

## 2021-11-26 DIAGNOSIS — N529 Male erectile dysfunction, unspecified: Secondary | ICD-10-CM | POA: Diagnosis not present

## 2021-11-26 DIAGNOSIS — F4322 Adjustment disorder with anxiety: Secondary | ICD-10-CM

## 2021-11-26 DIAGNOSIS — R41840 Attention and concentration deficit: Secondary | ICD-10-CM

## 2021-11-26 DIAGNOSIS — F411 Generalized anxiety disorder: Secondary | ICD-10-CM

## 2021-11-26 DIAGNOSIS — F32A Depression, unspecified: Secondary | ICD-10-CM

## 2021-11-26 DIAGNOSIS — F909 Attention-deficit hyperactivity disorder, unspecified type: Secondary | ICD-10-CM | POA: Insufficient documentation

## 2021-11-26 DIAGNOSIS — R030 Elevated blood-pressure reading, without diagnosis of hypertension: Secondary | ICD-10-CM

## 2021-11-26 MED ORDER — ATOMOXETINE HCL 40 MG PO CAPS: 40.0000 mg | ORAL_CAPSULE | Freq: Every day | ORAL | 2 refills | Status: AC

## 2021-11-26 NOTE — Assessment & Plan Note (Addendum)
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 ?

## 2021-11-26 NOTE — Assessment & Plan Note (Signed)
Elevated ADHD adult questionnaire ?Trial of Strattera 40 mg daily for 3 months ?Reassess in 3 months ?

## 2021-11-26 NOTE — Assessment & Plan Note (Signed)
Likely associated with anxiety ?Monitor on  atomoxetine ?Provided reassurance ?

## 2021-11-26 NOTE — Progress Notes (Signed)
? ?New Patient Office Visit ? ?Subjective:  ?Patient ID: Samuel Herrera, male    DOB: August 14, 1990  Age: 31 y.o. MRN: [id number] ? ?CC:  ?Chief Complaint  ?Patient presents with  ? Establish Care  ?  Np est care have concerns about getting and erection. Hard time focusing. Not fasting.  ? ? ?HPI ?Samuel Herrera presents for concern for attention, anxiety/stress from work, erectile dysfunction. ? ?Has history of ADHD.  Took Adderall XR 20 mg in college, has not been on for several years.  All has history of anxiety and depression.  No SI/HI.  Is not currently taking any medication.  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  This is been going on for several months. ?Has no family history of early cardiac death.  Denies any symptoms of chest pain or shortness of breath. ? ?Erectile dysfunction.  Has occurred over the past week.  Has happened a few times over the past week.  He is also able to maintain an erection other days ago.  Denies any urinary symptoms. ? ?Past Medical History:  ?Diagnosis Date  ? Anxiety   ? panic attacks - overwhelming feeling of school work   ? Depression   ? Fracture closed, humerus, shaft 10/03/2015  ? H/O multiple concussions 2005-2013 X 10  ? "I've had ~ 10 concussions" (11/14/2017)  ? Migraine   ? "a few/yr" (11/14/2017)  ? Perforated appendicitis 11/14/2017  ? Postprocedural intraabdominal abscess 12/01/2017  ? ? ?Past Surgical History:  ?Procedure Laterality Date  ? APPENDECTOMY  11/14/2017  ? FRACTURE SURGERY    ? LAPAROSCOPIC APPENDECTOMY N/A 11/14/2017  ? Procedure: APPENDECTOMY LAPAROSCOPIC;  Surgeon: Rolm Bookbinder, MD;  Location: [place];  Service: General;  Laterality: N/A;  ? ORIF HUMERUS FRACTURE Right 10/03/2015  ? Procedure: OPEN REDUCTION INTERNAL FIXATION (ORIF) RIGHT HUMERUS FRACTURE WITH RADIAL NERVE NEUROLYSIS, REPAIR AND RECONTRUCT AS NECESSARY;  Surgeon: Roseanne Kaufman, MD;  Location: [facility];  Service:  Orthopedics;  Laterality: Right;  ? WRIST SURGERY Right 2004  ? "took bone from wrist and put in middle finger that had been broken"  ? ? ?Family History  ?Problem Relation Age of Onset  ? Diabetes Mother   ? Diabetes Father   ? ? ?Social History  ? ?Socioeconomic History  ? Marital status: Significant Other  ?  Spouse name: Not on file  ? Number of children: Not on file  ? Years of education: Not on file  ? Highest education level: Not on file  ?Occupational History  ? Not on file  ?Tobacco Use  ? Smoking status: Never  ? Smokeless tobacco: Never  ?Vaping Use  ? Vaping Use: Never used  ?Substance and Sexual Activity  ? Alcohol use: Yes  ?  Alcohol/week: 14.0 standard drinks  ?  Types: 14 Cans of beer per week  ? Drug use: Yes  ?  Types: Marijuana  ?  Comment: 11/14/2017 "daily"  ? Sexual activity: Yes  ?Other Topics Concern  ? Not on file  ?Social History Narrative  ? Not on file  ? ?Social Determinants of Health  ? ?Financial Resource Strain: Not on file  ?Food Insecurity: Not on file  ?Transportation Needs: Not on file  ?Physical Activity: Not on file  ?Stress: Not on file  ?Social Connections: Not on file  ?Intimate Partner Violence: Not on file  ? ? ?ROS ?As per HPI ? ?Objective:  ? ?  Today's Vitals: BP 140/76 (BP Location: Left Arm, Patient Position: Sitting, Cuff Size: Normal)   Pulse 74   Temp (!) 97 ?F (36.1 ?C) (Temporal)   Ht 6\' 1"  (1.854 m)   Wt 204 lb 9.6 oz (92.8 kg)   SpO2 98%   BMI 26.99 kg/m?  ?Gen: NAD, resting comfortably ?CV: RRR with no murmurs appreciated ?Pulm: NWOB, CTAB with no crackles, wheezes, or rhonchi ?MSK: no cyanosis, or clubbing noted ?Skin: warm, dry ?Neuro: grossly normal, moves all extremities ?Psych: Normal affect and thought content ? ? ? ?Assessment & Plan:  ? ?Problem List Items Addressed This Visit   ? ?  ? Other  ? GAD (generalized anxiety disorder)  ?  GAD7: 11 ?Comorbid ADHD ?Borderline hypertension, possibly from anxiety ?Trial of Strattera 40 mg daily for 3  months ?Follow-up after that ? ? ?  ?  ? Relevant Medications  ? atomoxetine (STRATTERA) 40 MG capsule  ? Depression  ?  History of depression Samuel Herrera chart ?PHQ9: < 9 ?No signs of depression, continue monitor on atomoxetine ? ?  ?  ? Relevant Medications  ? atomoxetine (STRATTERA) 40 MG capsule  ? ADHD (attention deficit hyperactivity disorder) - Primary  ?  Elevated ADHD adult questionnaire ?Trial of Strattera 40 mg daily for 3 months ?Reassess in 3 months ? ?  ?  ? Relevant Medications  ? atomoxetine (STRATTERA) 40 MG capsule  ? Elevated blood pressure reading in office without diagnosis of hypertension  ?  Possibly due to anxiety, ?We will monitor on atomoxetine ? ?  ?  ? Erectile dysfunction  ?  Likely associated with anxiety ?Monitor on  atomoxetine ?Provided reassurance ? ?  ?  ? ?Other Visit Diagnoses   ? ? Adjustment disorder with anxiety      ? Relevant Medications  ? atomoxetine (STRATTERA) 40 MG capsule  ? ?  ? ? ?Outpatient Encounter Medications as of 11/26/2021  ?Medication Sig  ? atomoxetine (STRATTERA) 40 MG capsule Take 1 capsule (40 mg total) by mouth daily.  ? [DISCONTINUED] acetaminophen (TYLENOL) 325 MG tablet Take 2 tablets (650 mg total) by mouth every 6 (six) hours as needed for mild pain or fever.  ? [DISCONTINUED] ibuprofen (ADVIL,MOTRIN) 400 MG tablet Take 1-2 tablets (400-800 mg total) by mouth every 8 (eight) hours as needed for headache, mild pain or moderate pain.  ? [DISCONTINUED] saccharomyces boulardii (FLORASTOR) 250 MG capsule You can buy this at any drug store over the counter, take it for the next month.  Follow package instructions. (Patient not taking: Reported on 11/26/2021)  ? ?No facility-administered encounter medications on file as of 11/26/2021.  ? ? ?Follow-up: Return in about 3 months (around 02/25/2022) for adhd and anxiety.  ? ?Bonnita Hollow, MD ? ?

## 2021-11-26 NOTE — Assessment & Plan Note (Addendum)
History of depression Samuel Herrera chart ?PHQ9: < 9 ?No signs of depression, continue monitor on atomoxetine ?

## 2021-11-26 NOTE — Assessment & Plan Note (Signed)
Possibly due to anxiety, ?We will monitor on atomoxetine ?

## 2021-11-27 ENCOUNTER — Telehealth: Payer: Self-pay | Admitting: Family Medicine

## 2021-11-27 DIAGNOSIS — F909 Attention-deficit hyperactivity disorder, unspecified type: Secondary | ICD-10-CM

## 2021-11-27 DIAGNOSIS — R030 Elevated blood-pressure reading, without diagnosis of hypertension: Secondary | ICD-10-CM

## 2021-11-27 MED ORDER — VENLAFAXINE HCL ER 37.5 MG PO CP24: 37.5000 mg | ORAL_CAPSULE | Freq: Every day | ORAL | 2 refills | Status: AC

## 2021-11-27 MED ORDER — VENLAFAXINE HCL ER 37.5 MG PO CP24: 37.5000 mg | ORAL_CAPSULE | Freq: Every day | ORAL | 0 refills | Status: DC

## 2021-11-27 NOTE — Addendum Note (Signed)
Addended by: Fanny Bien B on: 11/27/2021 11:41 AM ? ? Modules accepted: Orders ? ?

## 2021-11-27 NOTE — Telephone Encounter (Signed)
Pt is wanting a script comparable to  atomoxetine (STRATTERA) 40 MG capsule [[id number]. His pharmacy let him know that this script is off the preferred list, ti's $74 a month. Please send new script to [id number] - [place], [place] - [address] AT [facility] [address]  ?[address], [place] [address]  ?Phone:  [phone]  Fax:  [phone]  ?DEA #:  [id number] ?

## 2021-11-28 NOTE — Telephone Encounter (Signed)
Called and spoke to patient. Pt verbalized understanding.

## 2022-02-25 ENCOUNTER — Ambulatory Visit: Payer: BC Managed Care – PPO | Admitting: Family Medicine

## 2022-02-25 ENCOUNTER — Telehealth: Payer: Self-pay | Admitting: Family Medicine

## 2022-02-25 NOTE — Telephone Encounter (Signed)
Pt was a no show for an OV with Dr. Janee Morn on 02/25/22, I sent a no show letter.

## 2022-03-15 NOTE — Telephone Encounter (Signed)
1st no show, fee waived, letter sent 

## 2023-03-07 DIAGNOSIS — Z131 Encounter for screening for diabetes mellitus: Secondary | ICD-10-CM | POA: Diagnosis not present

## 2023-03-07 DIAGNOSIS — Z0000 Encounter for general adult medical examination without abnormal findings: Secondary | ICD-10-CM | POA: Diagnosis not present

## 2023-03-07 DIAGNOSIS — Z13220 Encounter for screening for lipoid disorders: Secondary | ICD-10-CM | POA: Diagnosis not present

## 2023-03-07 DIAGNOSIS — H6122 Impacted cerumen, left ear: Secondary | ICD-10-CM | POA: Diagnosis not present

## 2023-03-07 DIAGNOSIS — Z8639 Personal history of other endocrine, nutritional and metabolic disease: Secondary | ICD-10-CM | POA: Diagnosis not present

## 2023-03-07 DIAGNOSIS — R41840 Attention and concentration deficit: Secondary | ICD-10-CM | POA: Diagnosis not present

## 2023-03-07 DIAGNOSIS — R55 Syncope and collapse: Secondary | ICD-10-CM | POA: Diagnosis not present

## 2023-03-18 DIAGNOSIS — F1020 Alcohol dependence, uncomplicated: Secondary | ICD-10-CM | POA: Diagnosis not present

## 2023-03-19 DIAGNOSIS — F1020 Alcohol dependence, uncomplicated: Secondary | ICD-10-CM | POA: Diagnosis not present

## 2023-03-20 DIAGNOSIS — F1020 Alcohol dependence, uncomplicated: Secondary | ICD-10-CM | POA: Diagnosis not present

## 2023-03-21 ENCOUNTER — Telehealth: Payer: Self-pay | Admitting: Family Medicine

## 2023-03-21 DIAGNOSIS — F1020 Alcohol dependence, uncomplicated: Secondary | ICD-10-CM | POA: Diagnosis not present

## 2023-03-21 NOTE — Telephone Encounter (Signed)
Called patient to schedule next available appt. No answer left VM

## 2023-03-22 DIAGNOSIS — F1020 Alcohol dependence, uncomplicated: Secondary | ICD-10-CM | POA: Diagnosis not present

## 2023-03-24 DIAGNOSIS — F1020 Alcohol dependence, uncomplicated: Secondary | ICD-10-CM | POA: Diagnosis not present

## 2023-03-25 DIAGNOSIS — F1020 Alcohol dependence, uncomplicated: Secondary | ICD-10-CM | POA: Diagnosis not present

## 2023-03-26 DIAGNOSIS — F1020 Alcohol dependence, uncomplicated: Secondary | ICD-10-CM | POA: Diagnosis not present

## 2023-03-27 DIAGNOSIS — F1020 Alcohol dependence, uncomplicated: Secondary | ICD-10-CM | POA: Diagnosis not present

## 2023-05-12 ENCOUNTER — Encounter (HOSPITAL_COMMUNITY): Payer: Self-pay | Admitting: Emergency Medicine

## 2023-05-12 ENCOUNTER — Emergency Department (HOSPITAL_COMMUNITY)
Admission: EM | Admit: 2023-05-12 | Discharge: 2023-05-12 | Disposition: A | Discharge status: Home / Self Care | Payer: BC Managed Care – PPO | Attending: Emergency Medicine | Admitting: Emergency Medicine

## 2023-05-12 DIAGNOSIS — W269XXA Contact with unspecified sharp object(s), initial encounter: Secondary | ICD-10-CM | POA: Insufficient documentation

## 2023-05-12 DIAGNOSIS — S61210A Laceration without foreign body of right index finger without damage to nail, initial encounter: Secondary | ICD-10-CM | POA: Insufficient documentation

## 2023-05-12 DIAGNOSIS — Y93G9 Activity, other involving cooking and grilling: Secondary | ICD-10-CM | POA: Insufficient documentation

## 2023-05-12 DIAGNOSIS — S6991XA Unspecified injury of right wrist, hand and finger(s), initial encounter: Secondary | ICD-10-CM | POA: Diagnosis not present

## 2023-05-12 NOTE — ED Provider Notes (Signed)
[place] EMERGENCY DEPARTMENT AT [facility] Provider Note   CSN: [id number] Arrival date & time: 05/12/23  1249     History  Chief Complaint  Patient presents with   Finger Injury    ERMA JOUBERT is a 32 y.o. male.  HPI   32 year old male presents emergency department with complaints of laceration.  Patient states that he was cutting vegetables earlier today and suffered laceration to his right pointer finger.  Patient states he is left-handed.  Reports his last tetanus vaccination was within the past 10 years.  Denies trauma elsewhere.  Past medical history significant for anxiety, depression, GAD, ADHD  Home Medications Prior to Admission medications   Medication Sig Start Date End Date Taking? Authorizing Provider  atomoxetine (STRATTERA) 40 MG capsule Take 1 capsule (40 mg total) by mouth daily. 11/26/21 02/24/22  Garnette Gunner, MD  venlafaxine XR (EFFEXOR-XR) 37.5 MG 24 hr capsule Take 1 capsule (37.5 mg total) by mouth daily with breakfast. 11/27/21 02/25/22  Garnette Gunner, MD      Allergies    Amoxicillin and Penicillins    Review of Systems   Review of Systems  All other systems reviewed and are negative.   Physical Exam Updated Vital Signs BP (!) 131/92 (BP Location: Left Arm)   Pulse 72   Temp 97.6 F (36.4 C) (Oral)   Resp 17   SpO2 100%  Physical Exam Vitals and nursing note reviewed.  Constitutional:      General: He is not in acute distress.    Appearance: He is well-developed.  HENT:     Head: Normocephalic and atraumatic.  Eyes:     Conjunctiva/sclera: Conjunctivae normal.  Cardiovascular:     Rate and Rhythm: Normal rate and regular rhythm.     Heart sounds: No murmur heard. Pulmonary:     Effort: Pulmonary effort is normal. No respiratory distress.     Breath sounds: Normal breath sounds.  Abdominal:     Palpations: Abdomen is soft.     Tenderness: There is no abdominal tenderness.  Musculoskeletal:        General:  No swelling.       Hands:     Cervical back: Neck supple.     Comments: Patient with 1.5 linear laceration appreciated on the dorsal medial aspect of second digit just lateral to nail bed.  Small skin avulsion/flap appreciated.  No obvious foreign body.  Full range of motion of digit.  No obvious ligamentous/tendinous involvement.  Skin:    General: Skin is warm and dry.     Capillary Refill: Capillary refill takes less than 2 seconds.  Neurological:     Mental Status: He is alert.  Psychiatric:        Mood and Affect: Mood normal.     ED Results / Procedures / Treatments   Labs (all labs ordered are listed, but only abnormal results are displayed) Labs Reviewed - No data to display  EKG None  Radiology No results found.  Procedures .Marland KitchenLaceration Repair  Date/Time: 05/12/2023 1:34 PM  Performed by: Peter Garter, PA Authorized by: Peter Garter, PA   Consent:    Consent obtained:  Verbal   Consent given by:  Patient   Risks, benefits, and alternatives were discussed: yes     Risks discussed:  Need for additional repair, infection, nerve damage, poor wound healing, poor cosmetic result, pain, tendon damage, vascular damage and retained foreign body   Alternatives discussed:  No treatment, delayed treatment, observation and referral Universal protocol:    Procedure explained and questions answered to patient or proxy's satisfaction: yes     Patient identity confirmed:  Arm band and verbally with patient Anesthesia:    Anesthesia method:  None Laceration details:    Location:  Finger   Finger location:  R index finger   Length (cm):  1.5 Pre-procedure details:    Preparation:  Patient was prepped and draped in usual sterile fashion Exploration:    Limited defect created (wound extended): no     Hemostasis achieved with:  Direct pressure   Imaging outcome: foreign body not noted     Wound exploration: wound explored through full range of motion and entire depth  of wound visualized     Contaminated: no   Treatment:    Area cleansed with:  Saline   Amount of cleaning:  Standard   Irrigation solution:  Sterile saline   Irrigation volume:  200cc   Irrigation method:  Syringe   Visualized foreign bodies/material removed: no     Debridement:  None   Undermining:  None   Scar revision: no   Skin repair:    Repair method:  Tissue adhesive Approximation:    Approximation:  Close Repair type:    Repair type:  Simple Post-procedure details:    Dressing:  Open (no dressing)   Procedure completion:  Tolerated well, no immediate complications     Medications Ordered in ED Medications - No data to display  ED Course/ Medical Decision Making/ A&P                                 Medical Decision Making  This patient presents to the ED for concern of finger laceration, this involves an extensive number of treatment options, and is a complaint that carries with it a high risk of complications and morbidity.  The differential diagnosis includes fracture, strain/pain, dislocation, ligamentous/tendinous injury, neurovascular compromise.   Co morbidities that complicate the patient evaluation  See HPI   Additional history obtained:  Additional history obtained from EMR External records from outside source obtained and reviewed including hospital records   Lab Tests:  N/a   Imaging Studies ordered:  N/a   Cardiac Monitoring: / EKG:  The patient was maintained on a cardiac monitor.  I personally viewed and interpreted the cardiac monitored which showed an underlying rhythm of: Sinus rhythm   Consultations Obtained:  N/a   Problem List / ED Course / Critical interventions / Medication management  Finger laceration I ordered medication including Dermabond  Reevaluation of the patient after these medicines showed that the patient improved I have reviewed the patients home medicines and have made adjustments as needed   Social  Determinants of Health:  Denies tobacco, illicit drug use   Test / Admission - Considered:  Finger laceration Vitals signs within normal range and stable throughout visit. 32 year old male presents emergency department with complaint of finger laceration while cutting vegetables in the kitchen.  Laceration rather superficial in nature.  Wound cleaned and repaired in manner as affected above.  No evidence of ligamentous/tendinous involvement.  Will recommend finger splint as needed in the outpatient setting.  Recommend Tylenol/Motrin for pain.  Treatment plan discussed at length with patient and he acknowledged understanding was agreeable to said plan.  Patient overall well-appearing, afebrile in no acute distress. Worrisome signs and symptoms were discussed with  the patient, and the patient acknowledged understanding to return to the ED if noticed. Patient was stable upon discharge.          Final Clinical Impression(s) / ED Diagnoses Final diagnoses:  Laceration of right index finger without foreign body without damage to nail, initial encounter    Rx / DC Orders ED Discharge Orders     None         Peter Garter, [place] 05/12/23 1335    Pricilla Loveless, MD 05/14/23 1244

## 2023-05-12 NOTE — Discharge Instructions (Addendum)
As discussed, wound was repaired using Dermabond.  This is a tissue glue.  It should dissolve in the next 7 days or so.  You may wash hands as normal but avoid submersion of the area glued in water as this will increase the rate at which the glue dissolves.  You may take Tylenol/Motrin for pain.  Please do not hesitate to return to emergency department for worrisome signs and symptoms we discussed become apparent.

## 2023-05-12 NOTE — ED Triage Notes (Signed)
Pt here from home with a lac to the pointer finger on the right hand from cutting vegetables, bleeding controlled

## 2023-05-14 DIAGNOSIS — S61220A Laceration with foreign body of right index finger without damage to nail, initial encounter: Secondary | ICD-10-CM | POA: Diagnosis not present

## 2023-05-14 DIAGNOSIS — W260XXA Contact with knife, initial encounter: Secondary | ICD-10-CM | POA: Diagnosis not present

## 2023-06-17 ENCOUNTER — Telehealth: Payer: Self-pay

## 2023-06-17 NOTE — Telephone Encounter (Signed)
Transition Care Management Unsuccessful Follow-up Telephone Call  Date of discharge and from where:  [facility]  Attempts:  1st Attempt  Reason for unsuccessful TCM follow-up call:  No answer/busy   Lenard Forth [place]  [facility], [facility] Guide, Phone: [phone] Website: [email or web address]

## 2023-06-18 ENCOUNTER — Telehealth: Payer: Self-pay

## 2023-06-18 ENCOUNTER — Telehealth: Payer: Self-pay | Admitting: Family Medicine

## 2023-06-18 NOTE — Telephone Encounter (Signed)
error 

## 2023-06-18 NOTE — Telephone Encounter (Signed)
Transition Care Management Unsuccessful Follow-up Telephone Call  Date of discharge and from where:  [facility]  Attempts:  2nd Attempt  Reason for unsuccessful TCM follow-up call:  No answer/busy   Lenard Forth [place]  [facility], [facility] Guide, Phone: [phone] Website: [email or web address]

## 2024-03-11 DIAGNOSIS — H6123 Impacted cerumen, bilateral: Secondary | ICD-10-CM | POA: Diagnosis not present

## 2024-03-11 DIAGNOSIS — Z0000 Encounter for general adult medical examination without abnormal findings: Secondary | ICD-10-CM | POA: Diagnosis not present

## 2024-04-14 DIAGNOSIS — F321 Major depressive disorder, single episode, moderate: Secondary | ICD-10-CM | POA: Diagnosis not present

## 2024-06-16 DIAGNOSIS — R41840 Attention and concentration deficit: Secondary | ICD-10-CM | POA: Diagnosis not present

## 2024-06-18 DIAGNOSIS — Z87820 Personal history of traumatic brain injury: Secondary | ICD-10-CM | POA: Diagnosis not present

## 2024-06-18 DIAGNOSIS — R41840 Attention and concentration deficit: Secondary | ICD-10-CM | POA: Diagnosis not present

## 2024-06-18 DIAGNOSIS — Z79899 Other long term (current) drug therapy: Secondary | ICD-10-CM | POA: Diagnosis not present

## 2024-06-18 DIAGNOSIS — F902 Attention-deficit hyperactivity disorder, combined type: Secondary | ICD-10-CM | POA: Diagnosis not present
